# Patient Record
Sex: Male | Born: 1960
Health system: Southern US, Community
[De-identification: ages and names within clinical notes are randomized; demographics above are authoritative.]

## PROBLEM LIST (undated history)

## (undated) DIAGNOSIS — Z9989 Dependence on other enabling machines and devices: Secondary | ICD-10-CM

## (undated) DIAGNOSIS — E785 Hyperlipidemia, unspecified: Secondary | ICD-10-CM

## (undated) DIAGNOSIS — E119 Type 2 diabetes mellitus without complications: Secondary | ICD-10-CM

## (undated) DIAGNOSIS — G4733 Obstructive sleep apnea (adult) (pediatric): Secondary | ICD-10-CM

## (undated) HISTORY — DX: Obstructive sleep apnea (adult) (pediatric): G47.33

## (undated) HISTORY — DX: Hyperlipidemia, unspecified: E78.5

## (undated) HISTORY — DX: Dependence on other enabling machines and devices: Z99.89

## (undated) HISTORY — PX: CATARACT EXTRACTION: SUR2

## (undated) HISTORY — DX: Type 2 diabetes mellitus without complications: E11.9

---

## 1979-08-05 HISTORY — PX: KNEE ARTHROSCOPY: SUR90

## 1997-10-20 ENCOUNTER — Other Ambulatory Visit: Admission: RE | Admit: 1997-10-20 | Discharge: 1997-10-20 | Payer: Self-pay | Admitting: *Deleted

## 2004-10-24 ENCOUNTER — Ambulatory Visit: Payer: Self-pay | Admitting: Family Medicine

## 2005-04-09 ENCOUNTER — Ambulatory Visit: Payer: Self-pay | Admitting: Family Medicine

## 2005-04-14 ENCOUNTER — Ambulatory Visit: Payer: Self-pay | Admitting: Family Medicine

## 2005-06-17 ENCOUNTER — Ambulatory Visit: Payer: Self-pay | Admitting: Family Medicine

## 2005-07-03 ENCOUNTER — Ambulatory Visit: Payer: Self-pay | Admitting: Family Medicine

## 2007-01-28 DIAGNOSIS — E785 Hyperlipidemia, unspecified: Secondary | ICD-10-CM | POA: Insufficient documentation

## 2007-02-04 ENCOUNTER — Ambulatory Visit: Payer: Self-pay | Admitting: Family Medicine

## 2007-02-04 ENCOUNTER — Encounter (INDEPENDENT_AMBULATORY_CARE_PROVIDER_SITE_OTHER): Payer: Self-pay | Admitting: Internal Medicine

## 2007-02-10 ENCOUNTER — Ambulatory Visit: Payer: Self-pay | Admitting: Internal Medicine

## 2007-02-12 LAB — CONVERTED CEMR LAB
AST: 26 units/L (ref 0–37)
Albumin: 4.2 g/dL (ref 3.5–5.2)
Alkaline Phosphatase: 54 units/L (ref 39–117)
BUN: 14 mg/dL (ref 6–23)
Basophils Absolute: 0 10*3/uL (ref 0.0–0.1)
Chloride: 106 meq/L (ref 96–112)
Cholesterol: 246 mg/dL (ref 0–200)
Creatinine, Ser: 1 mg/dL (ref 0.4–1.5)
Direct LDL: 170.8 mg/dL
MCHC: 34.2 g/dL (ref 30.0–36.0)
Monocytes Relative: 7.6 % (ref 3.0–11.0)
Potassium: 4.6 meq/L (ref 3.5–5.1)
RBC: 4.89 M/uL (ref 4.22–5.81)
RDW: 12.4 % (ref 11.5–14.6)
Total Bilirubin: 1 mg/dL (ref 0.3–1.2)
Total CHOL/HDL Ratio: 7.7
Triglycerides: 209 mg/dL (ref 0–149)

## 2007-02-25 ENCOUNTER — Ambulatory Visit: Payer: Self-pay | Admitting: Family Medicine

## 2007-05-27 ENCOUNTER — Encounter (INDEPENDENT_AMBULATORY_CARE_PROVIDER_SITE_OTHER): Payer: Self-pay | Admitting: *Deleted

## 2007-07-30 ENCOUNTER — Ambulatory Visit: Payer: Self-pay | Admitting: Family Medicine

## 2008-05-04 ENCOUNTER — Ambulatory Visit: Payer: Self-pay | Admitting: Family Medicine

## 2010-01-03 ENCOUNTER — Ambulatory Visit: Payer: Self-pay | Admitting: Family Medicine

## 2010-01-03 LAB — CONVERTED CEMR LAB
ALT: 33 units/L (ref 0–53)
AST: 24 units/L (ref 0–37)
Alkaline Phosphatase: 51 units/L (ref 39–117)
BUN: 16 mg/dL (ref 6–23)
Basophils Relative: 0.3 % (ref 0.0–3.0)
Chloride: 104 meq/L (ref 96–112)
Cholesterol: 241 mg/dL — ABNORMAL HIGH (ref 0–200)
Creatinine,U: 333.6 mg/dL
Direct LDL: 160.2 mg/dL
Eosinophils Relative: 2.6 % (ref 0.0–5.0)
HCT: 44.4 % (ref 39.0–52.0)
Hemoglobin: 15.1 g/dL (ref 13.0–17.0)
Lymphocytes Relative: 30 % (ref 12.0–46.0)
Lymphs Abs: 2.1 10*3/uL (ref 0.7–4.0)
Microalb Creat Ratio: 0.4 mg/g (ref 0.0–30.0)
Microalb, Ur: 1.3 mg/dL (ref 0.0–1.9)
Monocytes Relative: 7.6 % (ref 3.0–12.0)
Neutro Abs: 4.1 10*3/uL (ref 1.4–7.7)
Potassium: 4.7 meq/L (ref 3.5–5.1)
RBC: 4.85 M/uL (ref 4.22–5.81)
RDW: 13.5 % (ref 11.5–14.6)
Sodium: 142 meq/L (ref 135–145)
Total Bilirubin: 0.6 mg/dL (ref 0.3–1.2)
Total CHOL/HDL Ratio: 6
WBC: 6.9 10*3/uL (ref 4.5–10.5)

## 2010-01-08 ENCOUNTER — Encounter (INDEPENDENT_AMBULATORY_CARE_PROVIDER_SITE_OTHER): Payer: Self-pay | Admitting: *Deleted

## 2010-01-08 ENCOUNTER — Ambulatory Visit: Payer: Self-pay | Admitting: Family Medicine

## 2010-03-01 ENCOUNTER — Encounter (INDEPENDENT_AMBULATORY_CARE_PROVIDER_SITE_OTHER): Payer: Self-pay | Admitting: *Deleted

## 2010-03-04 ENCOUNTER — Ambulatory Visit: Payer: Self-pay | Admitting: Gastroenterology

## 2010-03-18 ENCOUNTER — Ambulatory Visit: Payer: Self-pay | Admitting: Gastroenterology

## 2010-03-18 LAB — HM COLONOSCOPY

## 2010-09-03 NOTE — Miscellaneous (Signed)
Summary: previsit/rm  Clinical Lists Changes  Medications: Added new medication of MOVIPREP 100 GM  SOLR (PEG-KCL-NACL-NASULF-NA ASC-C) As per prep instructions. - Signed Rx of MOVIPREP 100 GM  SOLR (PEG-KCL-NACL-NASULF-NA ASC-C) As per prep instructions.;  #1 x 0;  Signed;  Entered by: Sherren Kerns RN;  Authorized by: Mardella Layman MD [facility];  Method used: [facility]  [address], [place], [place]  [address], Ph: [phone] or [phone], Fax: [phone] Observations: Added new observation of ALLERGY REV: Done (03/04/2010 8:28)    Prescriptions: MOVIPREP 100 GM  SOLR (PEG-KCL-NACL-NASULF-NA ASC-C) As per prep instructions.  #1 x 0   Entered by:   Sherren Kerns RN   Authorized by:   Mardella Layman MD [facility]   Signed by:   Sherren Kerns RN on 03/04/2010   Method used:   [facility]ronically [facility]        [facility]  [address] (retail)       [address]       [place], [place]  [address]       Ph: [phone] or [phone]       Fax: [phone]   RxID:   [id number]

## 2010-09-03 NOTE — Letter (Signed)
Summary: Previsit letter  [facility]  [address] [place], [place] [address]   Phone: [phone]  Fax: [phone]       01/08/2010 MRN: [id number]  Eric Fitzgerald [facility] [address] [place], [place]  [address]  Dear Mr. CHUONG,  Welcome to the Gastroenterology Division at [facility].    You are scheduled to see a nurse for your pre-procedure visit on 03-04-10 at 8:30am on the 3rd floor at [facility], [address]  We ask that you try to arrive at our office 15 minutes prior to your appointment time to allow for check-in.  Your nurse visit will consist of discussing your medical and surgical history, your immediate family medical history, and your medications.    Please bring a complete list of all your medications or, if you prefer, bring the medication bottles and we will list them.  We will need to be aware of both prescribed and over the counter drugs.  We will need to know exact dosage information as well.  If you are on blood thinners (Coumadin, Plavix, Aggrenox, Ticlid, etc.) please call our office today/prior to your appointment, as we need to consult with your physician about holding your medication.  Your COLONOSCOPY   is scheduled for 03-18-10 9:30am but check in is at 8:30am.  Please be prepared to read and sign documents such as consent forms, a financial agreement, and acknowledgement forms.  If necessary, and with your consent, a friend or relative is welcome to sit-in on the nurse visit with you.  Please bring your insurance card so that we may make a copy of it.  If your insurance requires a referral to see a specialist, please bring your referral form from your primary care physician.  No co-pay is required for this nurse visit.     If you cannot keep your appointment, please call [phone] to cancel or reschedule prior to your appointment date.  This allows [place] the opportunity to schedule an appointment for another patient in need of care.     Thank you for choosing [facility] for your medical needs.  We appreciate the opportunity to care for you.  Please visit [place] at our website  to learn more about our practice.                     Sincerely.                                                                                                                   The Gastroenterology Division

## 2010-09-03 NOTE — Procedures (Signed)
Summary: Colonoscopy  Patient: Eric Fitzgerald Note: All result statuses are Final unless otherwise noted.  Tests: (1) Colonoscopy (COL)   COL Colonoscopy           DONE     [facility]     [address]     [place], [place]  [address]           COLONOSCOPY PROCEDURE REPORT           PATIENT:  Eric Fitzgerald, Eric Fitzgerald  MR#:  [id number]     BIRTHDATE:  14-Nov-1960, 49 yrs. old  GENDER:  male     ENDOSCOPIST:  Vania Rea. Jarold Motto, MD, [facility]     REF. BY:  Laurita Quint, M.D.     PROCEDURE DATE:  03/18/2010     PROCEDURE:  Average-risk screening colonoscopy     G0121     ASA CLASS:  Class I     INDICATIONS:  Routine Risk Screening     MEDICATIONS:   Fentanyl 50 mcg IV, Versed 5 mg IV           DESCRIPTION OF PROCEDURE:   After the risks benefits and     alternatives of the procedure were thoroughly explained, informed     consent was obtained.  Digital rectal exam was performed and     revealed no abnormalities.   The LB CF-[id number] endoscope     was introduced through the anus and advanced to the cecum, which     was identified by both the appendix and ileocecal valve, without     limitations.  The quality of the prep was excellent, using     MoviPrep.  The instrument was then slowly withdrawn as the colon     was fully examined.     <<PROCEDUREIMAGES>>           FINDINGS:  No polyps or cancers were seen.  This was otherwise a     normal examination of the colon.   Retroflexed views in the rectum     revealed no abnormalities.    The scope was then withdrawn from     the patient and the procedure completed.           COMPLICATIONS:  None     ENDOSCOPIC IMPRESSION:     1) No polyps or cancers     2) Otherwise normal examination     RECOMMENDATIONS:     1) Continue current colorectal screening recommendations for     "routine risk" patients with a repeat colonoscopy in 10 years.     REPEAT EXAM:  No           ______________________________     Vania Rea. Jarold Motto, MD,  Clementeen Graham           CC:           n.     eSIGNED:   Vania Rea. Patterson at 03/18/2010 10:10 AM           Harriett Sine, [phone]  Note: An exclamation mark (!) indicates a result that was not dispersed into the flowsheet. Document Creation Date: 03/18/2010 10:11 AM _______________________________________________________________________  (1) Order result status: Final Collection or observation date-time: 03/18/2010 10:04 Requested date-time:  Receipt date-time:  Reported date-time:  Referring Physician:   Ordering Physician: Sheryn Bison [phone]) Specimen Source:  Source: Launa Grill Order Number: [id number] Lab site:   Appended Document: Colonoscopy    Clinical Lists Changes  Observations: Added new observation of COLONNXTDUE:  03/2020 (03/18/2010 11:04)     

## 2010-09-03 NOTE — Assessment & Plan Note (Signed)
Summary: CPX / LFW   Vital Signs:  Patient profile:   50 year old male Height:      70 inches Weight:      243 pounds BMI:     34.99 Temp:     99.0 degrees F oral Pulse rate:   68 / minute Pulse rhythm:   regular BP sitting:   110 / 80  (left arm) Cuff size:   large  Vitals Entered By: Linde Gillis CMA Duncan Dull) (January 08, 2010 2:50 PM) CC: complete physical, patient would prefer colonoscopy over hemmocult cards   History of Present Illness: Pt here with his wife for Comp Exam. He has no complaints. He feels fine.   Preventive Screening-Counseling & Management  Alcohol-Tobacco     Alcohol drinks/day: 0     Smoking Status: never  Caffeine-Diet-Exercise     Caffeine use/day: 4     Does Patient Exercise: no  Problems Prior to Update: 1)  Uri  (ICD-465.9) 2)  Dermatophytosis of Groin and Perianal Area  (ICD-110.3) 3)  Syndrome X  (ICD-277.7) 4)  Examination, Routine Medical  (ICD-V70.0) 5)  Screening For Malignant Neoplasm, Prostate  (ICD-V76.44) 6)  Snoring  (ICD-786.09) 7)  Chest Pain  (ICD-786.50) 8)  Well Adult Exam  (ICD-V70.0) 9)  Hyperlipidemia  (ICD-272.4)  Medications Prior to Update: 1)  Naftin 1 %  Crea (Naftifine Hcl) .... Apply To Affected Area Once Daily As Directed 2)  Tussionex Pennkinetic Er 8-10 Mg/9ml Lqcr (Chlorpheniramine-Hydrocodone) .... One Tsp By Mouth At Night As Needed Cough  Allergies: 1)  ! Tricor (Fenofibrate) 2)  ! Niacin  Past History:  Past Medical History: Last updated: 01/28/2007 Hyperlipidemia  Family History: Last updated: 01/08/2010 Father: Alive 15  CABG, HTN   no further heart problems Mother: dec. 57, brain tumor Sister A 67  Bipolar Sister A 47 Depression CV:  (+) CAD, MGF died of MI, M.Uncle died MI BP:  (+) father, CABG father CA:  (+) brain tumor, mother Depression:  (+) throughout Stroke:  (-)  Social History: Last updated: 01/28/2007 Never Smoked Alcohol use-no Drug use-no Marital Status: Married lives  with wife Children: 2 at home Occupation: Emergency planning/management officer  Risk Factors: Alcohol Use: 0 (01/08/2010) Caffeine Use: 4 (01/08/2010) Exercise: no (01/08/2010)  Risk Factors: Smoking Status: never (01/08/2010)  Past Surgical History: R Arthroscopy 1981  Family History: Father: Alive 19  CABG, HTN   no further heart problems Mother: dec. 57, brain tumor Sister A 38  Bipolar Sister A 47 Depression CV:  (+) CAD, MGF died of MI, M.Uncle died MI BP:  (+) father, CABG father CA:  (+) brain tumor, mother Depression:  (+) throughout Stroke:  (-)  Social History: Caffeine use/day:  4  Review of Systems General:  Denies chills, fatigue, fever, sweats, weakness, and weight loss. Eyes:  Denies blurring, discharge, and eye pain. ENT:  Denies decreased hearing, ear discharge, and earache. CV:  Denies chest pain or discomfort, fainting, fatigue, palpitations, shortness of breath with exertion, swelling of feet, and swelling of hands. Resp:  Denies cough, shortness of breath, and wheezing. GI:  Complains of indigestion; denies abdominal pain, bloody stools, change in bowel habits, constipation, dark tarry stools, diarrhea, loss of appetite, nausea, vomiting, vomiting blood, and yellowish skin color. GU:  Complains of urinary frequency; denies discharge, dysuria, and nocturia. MS:  Denies joint pain, low back pain, muscle aches, cramps, and stiffness. Derm:  Denies dryness, itching, and rash. Neuro:  Denies poor balance,  tingling, and tremors.  Physical Exam  General:  Well-developed,well-nourished,in no acute distress; alert,appropriate and cooperative throughout examination, overweight Head:  Normocephalic and atraumatic without obvious abnormalities. No apparent alopecia or balding. Sinuses nontender. Eyes:  Conjunctiva clear bilaterally.  Ears:  External ear exam shows no significant lesions or deformities.  Otoscopic examination reveals clear canals, tympanic membranes are  intact bilaterally without bulging, retraction, inflammation or discharge. Hearing is grossly normal bilaterally. L TM dull to LR. Nose:  External nasal examination shows no deformity or inflammation. Nasal mucosa are pink and moist without lesions or exudates. Mouth:  Oral mucosa and oropharynx without lesions or exudates.  Teeth in good repair. Neck:  No deformities, masses, or tenderness noted. Chest Wall:  No deformities, masses, tenderness or gynecomastia noted. Breasts:  No masses or gynecomastia noted Lungs:  Normal respiratory effort, chest expands symmetrically. Lungs are clear to auscultation, no crackles or wheezes. Heart:  Normal rate and regular rhythm. S1 and S2 normal without gallop, murmur, click, rub or other extra sounds. Abdomen:  soft, non-tender, no masses, no abdominal hernia, no inguinal hernia, no hepatomegaly, and no splenomegaly.   Rectal:  No external abnormalities noted. Normal sphincter tone. No rectal masses or tenderness. G neg. Genitalia:  Testes bilaterally descended without nodularity, tenderness or masses. No scrotal masses or lesions. No penis lesions or urethral discharge. Prostate:  Prostate gland firm and smooth, no enlargement, nodularity, tenderness, mass, asymmetry or induration. Msk:  No deformity or scoliosis noted of thoracic or lumbar spine.   Pulses:  R and L carotid,radial,femoral,dorsalis pedis and posterior tibial pulses are full and equal bilaterally Extremities:  No clubbing, cyanosis, edema, or deformity noted with normal full range of motion of all joints.   Neurologic:  No cranial nerve deficits noted. Station and gait are normal. Sensory, motor and coordinative functions appear intact. Skin:  Intact without suspicious lesions or rashes, a few scattered benign moles. Cervical Nodes:  No lymphadenopathy noted Inguinal Nodes:  No significant adenopathy Psych:  Cognition and judgment appear intact. Alert and cooperative with normal attention span  and concentration. No apparent delusions, illusions, hallucinations   Impression & Recommendations:  Problem # 1:  WELL ADULT EXAM (ICD-V70.0) Assessment Comment Only  Reviewed preventive care protocols, scheduled due services, and updated immunizations.  Problem # 2:  SYNDROME X (ICD-277.7) Assessment: Unchanged Discussed.Needs diet and exercise.  Problem # 3:  SNORING (ICD-786.09) Assessment: Unchanged Discussed losing weight before sleep study ordered.  Problem # 4:  HYPERLIPIDEMIA (ICD-272.4) Assessment: Unchanged Discussed diet and exercise. Recheck in future and treat if needed. Labs Reviewed: SGOT: 24 (01/03/2010)   SGPT: 33 (01/03/2010)   HDL:37.30 (01/03/2010), 31.8 (02/10/2007)  LDL:DEL (02/10/2007)  Chol:241 (01/03/2010), 246 (02/10/2007)  Trig:247.0 (01/03/2010), 209 (02/10/2007)  Other Orders: Gastroenterology Referral (GI)  Patient Instructions: 1)  Refer for Colonoscopy.  Prior Medications (reviewed today): None Current Allergies (reviewed today): ! TRICOR (FENOFIBRATE) ! NIACIN

## 2010-09-03 NOTE — Letter (Signed)
Summary: Eric Fitzgerald Instructions  Eric Fitzgerald  [address] [place], [place] [address]   Phone: [phone]  Fax: [phone]       Eric Fitzgerald    May 29, 1961    MRN: [id number]        Procedure Day Dorna Bloom:  Eric Fitzgerald  03/18/10     Arrival Time:  8:30AM     Procedure Time:  9:30AM     Location of Procedure:                    _ X_  [facility] (4th Floor)                        PREPARATION FOR COLONOSCOPY WITH MOVIPREP   Starting 5 days prior to your procedure 03/13/10 do not eat nuts, seeds, popcorn, corn, beans, peas,  salads, or any raw vegetables.  Do not take any fiber supplements (e.g. Metamucil, Citrucel, and Benefiber).  THE DAY BEFORE YOUR PROCEDURE         DATE: 03/17/10  DAY: SUNDAY  1.  Drink clear liquids the entire day-NO SOLID FOOD  2.  Do not drink anything colored red or purple.  Avoid juices with pulp.  No orange juice.  3.  Drink at least 64 oz. (8 glasses) of fluid/clear liquids during the day to prevent dehydration and help the prep work efficiently.  CLEAR LIQUIDS INCLUDE: Water Jello Ice Popsicles Tea (sugar ok, no milk/cream) Powdered fruit flavored drinks Coffee (sugar ok, no milk/cream) Gatorade Juice: apple, white grape, white cranberry  Lemonade Clear bullion, consomm, broth Carbonated beverages (any kind) Strained chicken noodle soup Hard Candy                             4.  In the morning, mix first dose of MoviPrep solution:    Empty 1 Pouch A and 1 Pouch B into the disposable container    Add lukewarm drinking water to the top line of the container. Mix to dissolve    Refrigerate (mixed solution should be used within 24 hrs)  5.  Begin drinking the prep at 5:00 p.m. The MoviPrep container is divided by 4 marks.   Every 15 minutes drink the solution down to the next mark (approximately 8 oz) until the full liter is complete.   6.  Follow completed prep with 16 oz of clear liquid of your choice (Nothing  red or purple).  Continue to drink clear liquids until bedtime.  7.  Before going to bed, mix second dose of MoviPrep solution:    Empty 1 Pouch A and 1 Pouch B into the disposable container    Add lukewarm drinking water to the top line of the container. Mix to dissolve    Refrigerate  THE DAY OF YOUR PROCEDURE      DATE: 03/18/10  DAY: MONDAY  Beginning at 4:30AM (5 hours before procedure):         1. Every 15 minutes, drink the solution down to the next mark (approx 8 oz) until the full liter is complete.  2. Follow completed prep with 16 oz. of clear liquid of your choice.    3. You may drink clear liquids until 7:30AM (2 HOURS BEFORE PROCEDURE).   MEDICATION INSTRUCTIONS  Unless otherwise instructed, you should take regular prescription medications with a small sip of water   as early as possible the morning  of your procedure.   Additional medication instructions: n/a         OTHER INSTRUCTIONS  You will need a responsible adult at least 51 years of age to accompany you and drive you home.   This person must remain in the waiting room during your procedure.  Wear loose fitting clothing that is easily removed.  Leave jewelry and other valuables at home.  However, you may wish to bring a book to read or  an iPod/MP3 player to listen to music as you wait for your procedure to start.  Remove all body piercing jewelry and leave at home.  Total time from sign-in until discharge is approximately 2-3 hours.  You should go home directly after your procedure and rest.  You can resume normal activities the  day after your procedure.  The day of your procedure you should not:   Drive   Make legal decisions   Operate machinery   Drink alcohol   Return to work  You will receive specific instructions about eating, activities and medications before you leave.    The above instructions have been reviewed and explained to me by  Eric Kerns RN  March 04, 2010  8:42 AM    I fully understand and can verbalize these instructions _____________________________ Date _________

## 2011-03-17 ENCOUNTER — Ambulatory Visit (INDEPENDENT_AMBULATORY_CARE_PROVIDER_SITE_OTHER)
Admission: RE | Admit: 2011-03-17 | Discharge: 2011-03-17 | Disposition: A | Discharge status: Home / Self Care | Payer: Self-pay | Source: Ambulatory Visit | Attending: Family Medicine | Admitting: Family Medicine

## 2011-03-17 ENCOUNTER — Encounter: Payer: Self-pay | Admitting: Family Medicine

## 2011-03-17 ENCOUNTER — Ambulatory Visit (INDEPENDENT_AMBULATORY_CARE_PROVIDER_SITE_OTHER): Payer: Self-pay | Admitting: Family Medicine

## 2011-03-17 ENCOUNTER — Encounter: Payer: Self-pay | Admitting: Internal Medicine

## 2011-03-17 VITALS — BP 124/80 | HR 100 | Temp 100.7°F | Ht 70.5 in | Wt 240.8 lb

## 2011-03-17 DIAGNOSIS — R05 Cough: Secondary | ICD-10-CM

## 2011-03-17 DIAGNOSIS — R059 Cough, unspecified: Secondary | ICD-10-CM

## 2011-03-17 DIAGNOSIS — J189 Pneumonia, unspecified organism: Secondary | ICD-10-CM | POA: Insufficient documentation

## 2011-03-17 MED ORDER — AZITHROMYCIN 250 MG PO TABS: ORAL_TABLET | ORAL | Status: AC

## 2011-03-17 MED ORDER — GUAIFENESIN-CODEINE 100-10 MG/5ML PO SYRP: 5.0000 mL | ORAL_SOLUTION | Freq: Three times a day (TID) | ORAL | Status: AC | PRN

## 2011-03-17 NOTE — Assessment & Plan Note (Addendum)
Concern for PNA - CXR checked, LLL PNA on my read. Treat with zpack, codeine cough syrup for night time. rec continue mucinex and advil. Push fluids. Ok for [employer]. Update if red flags or not improving as expected.

## 2011-03-17 NOTE — Progress Notes (Signed)
  Subjective:    Patient ID: Eric Fitzgerald, male    DOB: 04-16-1961, 50 y.o.   MRN: [id number]  HPI CC: cough  5d h/o feeling achey, with cough.  Started with temperature and cough.  Tried taking advil cold/sinus, mucinex.  Coughing slightly productive.  Feeling feverish.    Mild nausea, abd pain, emesis x1.  Decreased appetite.  + rash left chest below nipple, pruritic.  Denies ST, congestion, RN.  No diarrhea, constipation.  No HA, RN.  + parents-in-law sick at home with PNA, no children sick.  Father in law smokes inside.  No h/o asthma, COPD.  Thinks may have small amt asthma.  Review of Systems Per HPI    Objective:   Physical Exam  Nursing note and vitals reviewed. Constitutional: He appears well-developed and well-nourished. No distress.  HENT:  Head: Normocephalic and atraumatic.  Right Ear: Hearing, tympanic membrane, external ear and ear canal normal.  Left Ear: Hearing, tympanic membrane, external ear and ear canal normal.  Nose: No mucosal edema or rhinorrhea. Right sinus exhibits no maxillary sinus tenderness and no frontal sinus tenderness. Left sinus exhibits no maxillary sinus tenderness and no frontal sinus tenderness.  Mouth/Throat: Uvula is midline, oropharynx is clear and moist and mucous membranes are normal. No oropharyngeal exudate or posterior oropharyngeal edema.  Eyes: Conjunctivae and EOM are normal. Pupils are equal, round, and reactive to light. No scleral icterus.  Neck: Normal range of motion. Neck supple.  Cardiovascular: Normal rate, regular rhythm, normal heart sounds and intact distal pulses.   No murmur heard. Pulmonary/Chest: Effort normal. No respiratory distress. He has no decreased breath sounds. He has no wheezes. He has no rhonchi. He has rales (and crackles) in the right lower field and the left lower field.  Lymphadenopathy:    He has no cervical adenopathy.  Skin: Skin is warm and dry. No rash noted.  Psychiatric: He has a normal mood and  affect.          Assessment & Plan:

## 2011-03-17 NOTE — Patient Instructions (Signed)
Take zpack as well as cough syrup for night time use (may make you sleepy). Continue mucinex with plenty of fluid and advil for symptomatic relief. Push fluids and plenty of rest over next few days. Update [place] if fever >101.5, worsening cough or shortness of breath, or not improving as expected.

## 2011-04-16 ENCOUNTER — Other Ambulatory Visit: Payer: Self-pay | Admitting: Family Medicine

## 2011-04-16 DIAGNOSIS — Z125 Encounter for screening for malignant neoplasm of prostate: Secondary | ICD-10-CM

## 2011-04-16 DIAGNOSIS — Z0000 Encounter for general adult medical examination without abnormal findings: Secondary | ICD-10-CM

## 2011-04-17 ENCOUNTER — Other Ambulatory Visit (INDEPENDENT_AMBULATORY_CARE_PROVIDER_SITE_OTHER): Payer: BC Managed Care – PPO

## 2011-04-17 DIAGNOSIS — Z0000 Encounter for general adult medical examination without abnormal findings: Secondary | ICD-10-CM

## 2011-04-17 DIAGNOSIS — Z125 Encounter for screening for malignant neoplasm of prostate: Secondary | ICD-10-CM

## 2011-04-17 LAB — LDL CHOLESTEROL, DIRECT: Direct LDL: 160.8 mg/dL

## 2011-04-17 LAB — COMPREHENSIVE METABOLIC PANEL
Albumin: 4.2 g/dL (ref 3.5–5.2)
BUN: 16 mg/dL (ref 6–23)
CO2: 29 mEq/L (ref 19–32)
Calcium: 9.2 mg/dL (ref 8.4–10.5)
Chloride: 103 mEq/L (ref 96–112)
Creatinine, Ser: 1 mg/dL (ref 0.4–1.5)
GFR: 84.34 mL/min (ref >60.00)
Glucose, Bld: 102 mg/dL — ABNORMAL HIGH (ref 70–99)
Potassium: 5 mEq/L (ref 3.5–5.1)

## 2011-04-17 LAB — LIPID PANEL: Cholesterol: 226 mg/dL — ABNORMAL HIGH (ref 0–200)

## 2011-04-22 ENCOUNTER — Encounter: Payer: Self-pay | Admitting: Family Medicine

## 2011-04-22 ENCOUNTER — Ambulatory Visit (INDEPENDENT_AMBULATORY_CARE_PROVIDER_SITE_OTHER): Payer: BC Managed Care – PPO | Admitting: Family Medicine

## 2011-04-22 VITALS — BP 128/80 | HR 76 | Temp 97.3°F | Ht 71.0 in | Wt 243.0 lb

## 2011-04-22 DIAGNOSIS — Z23 Encounter for immunization: Secondary | ICD-10-CM

## 2011-04-22 DIAGNOSIS — R7309 Other abnormal glucose: Secondary | ICD-10-CM

## 2011-04-22 DIAGNOSIS — E119 Type 2 diabetes mellitus without complications: Secondary | ICD-10-CM | POA: Insufficient documentation

## 2011-04-22 DIAGNOSIS — Z0000 Encounter for general adult medical examination without abnormal findings: Secondary | ICD-10-CM

## 2011-04-22 DIAGNOSIS — N400 Enlarged prostate without lower urinary tract symptoms: Secondary | ICD-10-CM

## 2011-04-22 DIAGNOSIS — E785 Hyperlipidemia, unspecified: Secondary | ICD-10-CM

## 2011-04-22 NOTE — Assessment & Plan Note (Signed)
DRE with slightly enlarged prostate, however given asxs declines treatment for now.  Continue to monitor.

## 2011-04-22 NOTE — Assessment & Plan Note (Signed)
Last fasting sugar 102, return in 6 mo for recheck fasting.

## 2011-04-22 NOTE — Progress Notes (Signed)
  Subjective:    Patient ID: Eric Fitzgerald, male    DOB: 09/08/1960, 50 y.o.   MRN: [id number]  HPI CC: CPE  No concerns today.  Reviewed labwork. Preventative: Colonoscopy - 03/2010 normal, rpt 10 years.  No family hx colon cancer Prostate - nocturia x2-3at night.  Strong stream.  No fmhx prostate cancer, no h/o BPH.  Discussed prostate screening. Desires yearly prostate screening. Flu shot today.  UTD tetanus.  Review of Systems  Constitutional: Negative for fever, chills, activity change, appetite change, fatigue and unexpected weight change.  HENT: Negative for hearing loss and neck pain.   Eyes: Negative for visual disturbance.  Respiratory: Negative for cough, chest tightness, shortness of breath and wheezing.   Cardiovascular: Negative for chest pain, palpitations and leg swelling.  Gastrointestinal: Negative for nausea, vomiting, abdominal pain, diarrhea, constipation, blood in stool and abdominal distention.  Genitourinary: Negative for hematuria and difficulty urinating.  Musculoskeletal: Negative for myalgias and arthralgias.  Skin: Negative for rash.  Neurological: Negative for dizziness, seizures, syncope and headaches.  Hematological: Does not bruise/bleed easily.  Psychiatric/Behavioral: Negative for dysphoric mood. The patient is not nervous/anxious.        Objective:   Physical Exam  Nursing note and vitals reviewed. Constitutional: He is oriented to person, place, and time. He appears well-developed and well-nourished. No distress.  HENT:  Head: Normocephalic and atraumatic.  Right Ear: External ear normal.  Left Ear: External ear normal.  Nose: Nose normal.  Mouth/Throat: Oropharynx is clear and moist. No oropharyngeal exudate.  Eyes: Conjunctivae and EOM are normal. Pupils are equal, round, and reactive to light.  Neck: Normal range of motion. Neck supple. No thyromegaly present.  Cardiovascular: Normal rate, regular rhythm, normal heart sounds and intact  distal pulses.   No murmur heard. Pulses:      Radial pulses are 2+ on the right side, and 2+ on the left side.  Pulmonary/Chest: Effort normal and breath sounds normal. No respiratory distress. He has no wheezes. He has no rales.  Abdominal: Soft. Bowel sounds are normal. He exhibits no distension and no mass. There is no tenderness. There is no rebound and no guarding.  Genitourinary: Rectum normal. Rectal exam shows no external hemorrhoid, no internal hemorrhoid, no fissure, no mass and anal tone normal. Prostate is enlarged (mild). Prostate is not tender.  Musculoskeletal: Normal range of motion.  Lymphadenopathy:    He has no cervical adenopathy.  Neurological: He is alert and oriented to person, place, and time.       CN grossly intact, station and gait intact  Skin: Skin is warm and dry. No rash noted.  Psychiatric: He has a normal mood and affect. His behavior is normal. Judgment and thought content normal.          Assessment & Plan:

## 2011-04-22 NOTE — Assessment & Plan Note (Signed)
Discussed healthy lifestyle and diet. Pt will work on increasing activity, has membership at gym, will start using. Flu shot today. UTD colon screening.

## 2011-04-22 NOTE — Patient Instructions (Signed)
See instructions handed to patient.

## 2011-04-22 NOTE — Assessment & Plan Note (Signed)
Somewhat elevated cholesterols. Discussed heatlhy eating to lower LDL.

## 2011-04-23 NOTE — Progress Notes (Signed)
See other note

## 2011-09-10 IMAGING — CR DG CHEST 2V
2 series · 2 of 2 positions shown · non-contrast
Comparison: None

CLINICAL DATA: Cough.  Fever.  Abnormal lung exam.

CHEST - 2 VIEW

[view not recorded (1 of 2)]
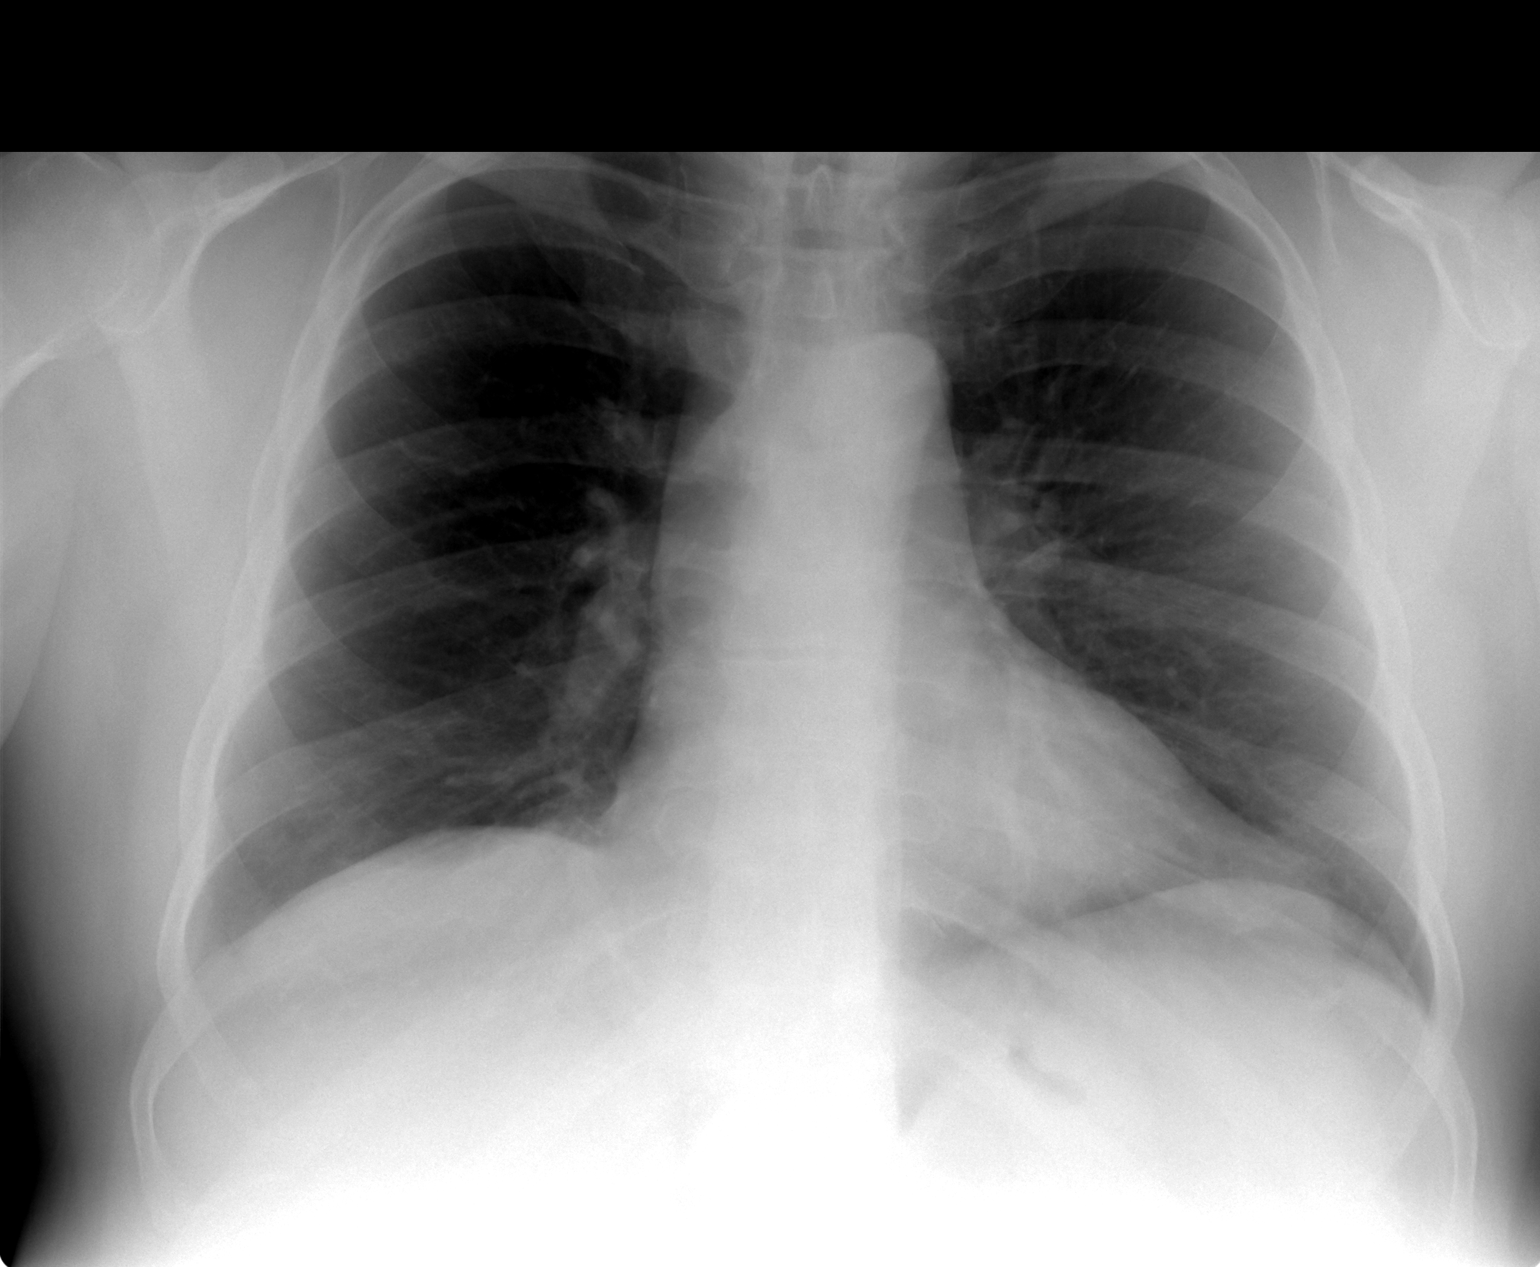

[view not recorded (2 of 2)]
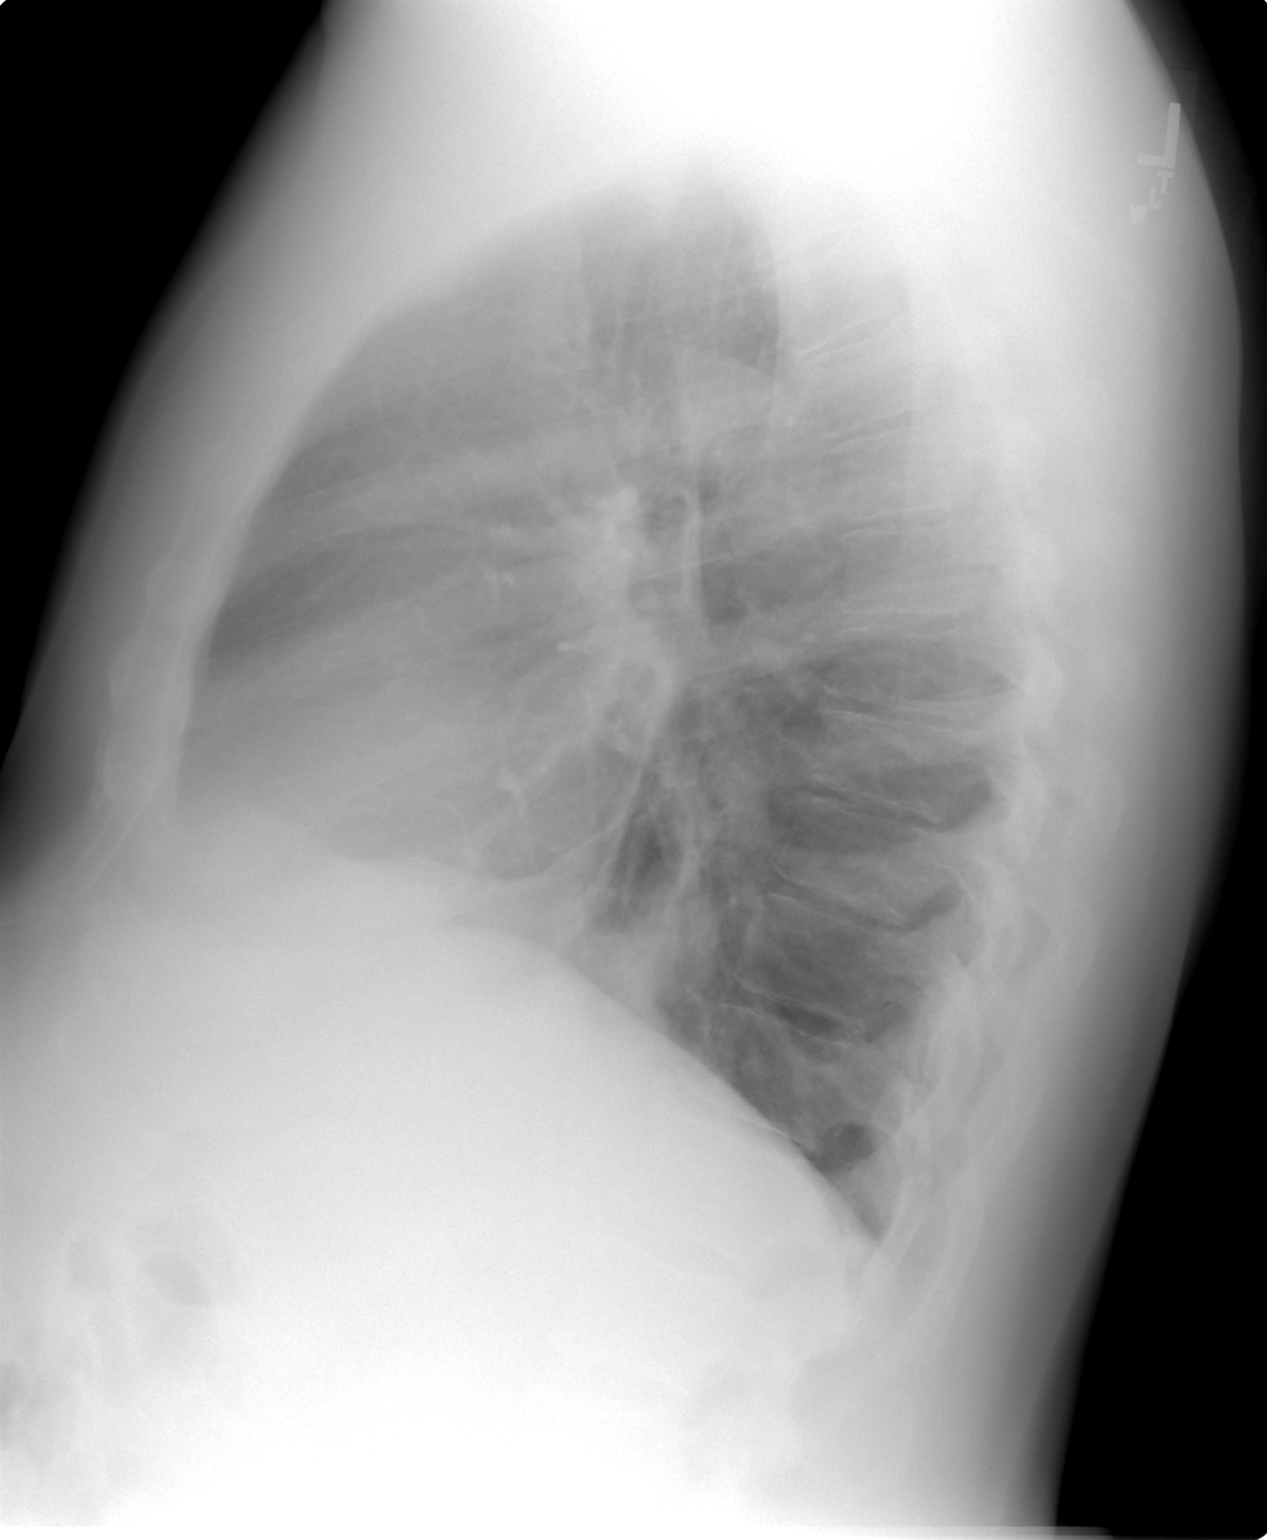

[2 of 2 positions shown; findings below may reference images not displayed]

FINDINGS: Heart size is normal.  Mediastinal shadows are normal.
The right lung is clear.  There is patchy infiltrate in the left
lower lobe.  No effusion.  No significant bony finding.
IMPRESSION: Patchy infiltrate at the left base consistent with left lower lobe
pneumonia.

## 2012-12-10 ENCOUNTER — Ambulatory Visit (INDEPENDENT_AMBULATORY_CARE_PROVIDER_SITE_OTHER): Payer: BC Managed Care – PPO | Admitting: Family Medicine

## 2012-12-10 ENCOUNTER — Encounter: Payer: Self-pay | Admitting: Family Medicine

## 2012-12-10 VITALS — BP 128/90 | HR 84 | Temp 98.4°F | Ht 71.0 in | Wt 247.2 lb

## 2012-12-10 DIAGNOSIS — Z23 Encounter for immunization: Secondary | ICD-10-CM

## 2012-12-10 DIAGNOSIS — E785 Hyperlipidemia, unspecified: Secondary | ICD-10-CM

## 2012-12-10 DIAGNOSIS — N400 Enlarged prostate without lower urinary tract symptoms: Secondary | ICD-10-CM

## 2012-12-10 DIAGNOSIS — Z125 Encounter for screening for malignant neoplasm of prostate: Secondary | ICD-10-CM

## 2012-12-10 DIAGNOSIS — Z0000 Encounter for general adult medical examination without abnormal findings: Secondary | ICD-10-CM

## 2012-12-10 LAB — BASIC METABOLIC PANEL
CO2: 26 mEq/L (ref 19–32)
Calcium: 9.5 mg/dL (ref 8.4–10.5)
Creatinine, Ser: 1.2 mg/dL (ref 0.4–1.5)
GFR: 69.11 mL/min (ref >60.00)
Glucose, Bld: 95 mg/dL (ref 70–99)

## 2012-12-10 LAB — LIPID PANEL: HDL: 36.4 mg/dL — ABNORMAL LOW (ref >39.00)

## 2012-12-10 LAB — PSA: PSA: 0.66 ng/mL (ref 0.10–4.00)

## 2012-12-10 NOTE — Assessment & Plan Note (Addendum)
Preventative protocols reviewed and updated unless pt declined. Discussed healthy diet and lifestyle. Tdap today.  Discussed screening abd [place] - and young age, and nonsmoker status. Looked at different guidelines: USPSTF - start at 54yo in smokers AHA - start at 54yo in fmhx Vascular societies - start at 55yo in fmhx I've asked him to check with insurance about coverage, consider at age 81yo

## 2012-12-10 NOTE — Patient Instructions (Addendum)
Let's ether start fish oil supplement daily or increase fish in diet - this will help lower triglycerides. Blood work today. Tetanus and pertussis today. Check with insurance if they will cover screening ultrasound for abdominal aneurysm at age 52 given family history

## 2012-12-10 NOTE — Assessment & Plan Note (Signed)
Minimal - continue to monitor. Recommended back off drinking fluids at bedtime.

## 2012-12-10 NOTE — Assessment & Plan Note (Signed)
Persistently elevated in past - recheck today. Discussed increased fish in diet and consider fish oil supplementation.

## 2012-12-10 NOTE — Progress Notes (Signed)
Subjective:    Patient ID: Eric Fitzgerald, male    DOB: 26-Aug-1960, 52 y.o.   MRN: [id number]  HPI CC: CPE   Father with h/o AAA - found at age 77, treated with stent.  Asks about screening aneurysm.  Pt has never smoked. BP Readings from Last 3 Encounters:  12/10/12 128/90  04/22/11 128/80  04/22/11 128/80   Dyslipidemia - last check was >1 yr ago.  Eats fish seldom.  Preventative:  Colonoscopy - 03/2010 normal, rpt 10 years. No family hx colon cancer  Prostate - nocturia x2-3at night. Strong stream. No fmhx prostate cancer, no h/o BPH. Discussed prostate screening. Desires yearly prostate screening.  Flu shot occasionally Tetanus - unsure.  Tdap today.  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  Past histories reviewed and updated if relevant as below. Patient Active Problem List   Diagnosis Date Noted  . Prediabetes 04/22/2011  . BPH (benign prostatic hypertrophy) 04/22/2011  . Healthcare maintenance 04/16/2011  . SYNDROME X 02/25/2007  . SNORING 02/04/2007  . HYPERLIPIDEMIA 01/28/2007   Past Medical History  Diagnosis Date  . HLD (hyperlipidemia)    Past Surgical History  Procedure Laterality Date  . Knee arthroscopy  1981    Right   History  Substance Use Topics  . Smoking status: Never Smoker   . Smokeless tobacco: Never Used  . Alcohol Use: Yes     Comment: Rare   Family History  Problem Relation Age of Onset  . CAD Father 65    5v CABG  . Other Mother 47    Brain tumor  . Bipolar disorder Sister   . Depression Sister   . CAD Maternal Grandfather     MI  . CAD Maternal Uncle     MI  . AAA (abdominal aortic aneurysm) Father     smoker  . Stroke Neg Hx   . Diabetes Neg Hx    Allergies  Allergen Reactions  . Fenofibrate     REACTION: joint pain, aches  . Niacin     REACTION:  hives, skin burning   No current outpatient prescriptions on file prior to visit.   No current facility-administered medications on file prior to visit.     Review of Systems  Constitutional: Negative for fever, chills, activity change, appetite change, fatigue and unexpected weight change.  HENT: Negative for hearing loss and neck pain.   Eyes: Negative for visual disturbance.  Respiratory: Negative for cough, chest tightness, shortness of breath and wheezing.   Cardiovascular: Negative for chest pain, palpitations and leg swelling.  Gastrointestinal: Negative for nausea, vomiting, abdominal pain, diarrhea, constipation, blood in stool and abdominal distention.  Genitourinary: Negative for hematuria and difficulty urinating.  Musculoskeletal: Negative for myalgias and arthralgias.  Skin: Negative for rash.  Neurological: Negative for dizziness, seizures, syncope and headaches.  Hematological: Negative for adenopathy. Does not bruise/bleed easily.  Psychiatric/Behavioral: Negative for dysphoric mood. The patient is not nervous/anxious.        Objective:   Physical Exam  Nursing note and vitals reviewed. Constitutional: He is oriented to person, place, and time. He appears well-developed and well-nourished. No distress.  HENT:  Head: Normocephalic and atraumatic.  Nose: Nose normal.  Mouth/Throat: Oropharynx is clear and moist. No oropharyngeal exudate.  Eyes: Conjunctivae and EOM are normal. Pupils are equal, round, and reactive to light. No scleral  icterus.  Neck: Normal range of motion. Neck supple. Carotid bruit is not present. No thyromegaly present.  Cardiovascular: Normal rate, regular rhythm, normal heart sounds and intact distal pulses.   No murmur heard. Pulses:      Radial pulses are 2+ on the right side, and 2+ on the left side.  Pulmonary/Chest: Effort normal and breath sounds normal. No respiratory distress. He has no wheezes. He has no rales.  Abdominal: Soft.  Bowel sounds are normal. He exhibits no distension and no mass. There is no tenderness. There is no rebound and no guarding.  Central obesity No abd/renal bruit  Musculoskeletal: Normal range of motion. He exhibits no edema.  Lymphadenopathy:    He has no cervical adenopathy.  Neurological: He is alert and oriented to person, place, and time.  CN grossly intact, station and gait intact  Skin: Skin is warm and dry. No rash noted.  Psychiatric: He has a normal mood and affect. His behavior is normal. Judgment and thought content normal.       Assessment & Plan:

## 2012-12-10 NOTE — Addendum Note (Signed)
Addended by: Josph Macho A on: 12/10/2012 11:35 AM   Modules accepted: Orders

## 2012-12-13 ENCOUNTER — Encounter: Payer: Self-pay | Admitting: *Deleted

## 2014-07-23 ENCOUNTER — Other Ambulatory Visit: Payer: Self-pay | Admitting: Family Medicine

## 2014-07-23 DIAGNOSIS — E785 Hyperlipidemia, unspecified: Secondary | ICD-10-CM

## 2014-07-23 DIAGNOSIS — R739 Hyperglycemia, unspecified: Secondary | ICD-10-CM

## 2014-07-23 DIAGNOSIS — Z125 Encounter for screening for malignant neoplasm of prostate: Secondary | ICD-10-CM

## 2014-07-23 DIAGNOSIS — N400 Enlarged prostate without lower urinary tract symptoms: Secondary | ICD-10-CM

## 2014-07-26 ENCOUNTER — Other Ambulatory Visit (INDEPENDENT_AMBULATORY_CARE_PROVIDER_SITE_OTHER): Payer: BC Managed Care – PPO

## 2014-07-26 DIAGNOSIS — R739 Hyperglycemia, unspecified: Secondary | ICD-10-CM

## 2014-07-26 DIAGNOSIS — Z125 Encounter for screening for malignant neoplasm of prostate: Secondary | ICD-10-CM

## 2014-07-26 DIAGNOSIS — E785 Hyperlipidemia, unspecified: Secondary | ICD-10-CM

## 2014-07-26 LAB — COMPREHENSIVE METABOLIC PANEL
ALBUMIN: 4.1 g/dL (ref 3.5–5.2)
ALT: 30 U/L (ref 0–53)
AST: 26 U/L (ref 0–37)
Alkaline Phosphatase: 56 U/L (ref 39–117)
BUN: 16 mg/dL (ref 6–23)
CALCIUM: 9.4 mg/dL (ref 8.4–10.5)
CHLORIDE: 102 meq/L (ref 96–112)
CO2: 28 meq/L (ref 19–32)
Creatinine, Ser: 1.1 mg/dL (ref 0.4–1.5)
GFR: 70.78 mL/min (ref >60.00)
Glucose, Bld: 106 mg/dL — ABNORMAL HIGH (ref 70–99)
POTASSIUM: 4.8 meq/L (ref 3.5–5.1)
SODIUM: 137 meq/L (ref 135–145)
TOTAL PROTEIN: 7.3 g/dL (ref 6.0–8.3)
Total Bilirubin: 0.7 mg/dL (ref 0.2–1.2)

## 2014-07-26 LAB — LDL CHOLESTEROL, DIRECT: Direct LDL: 163.1 mg/dL

## 2014-07-26 LAB — LIPID PANEL
Cholesterol: 241 mg/dL — ABNORMAL HIGH (ref 0–200)
HDL: 34.8 mg/dL — ABNORMAL LOW (ref >39.00)
NonHDL: 206.2
Total CHOL/HDL Ratio: 7
Triglycerides: 226 mg/dL — ABNORMAL HIGH (ref 0.0–149.0)
VLDL: 45.2 mg/dL — ABNORMAL HIGH (ref 0.0–40.0)

## 2014-07-26 LAB — HEMOGLOBIN A1C: HEMOGLOBIN A1C: 6.5 % (ref 4.6–6.5)

## 2014-07-26 LAB — PSA: PSA: 0.81 ng/mL (ref 0.10–4.00)

## 2014-08-02 ENCOUNTER — Telehealth: Payer: Self-pay | Admitting: *Deleted

## 2014-08-02 ENCOUNTER — Encounter: Payer: Self-pay | Admitting: Family Medicine

## 2014-08-02 ENCOUNTER — Ambulatory Visit (INDEPENDENT_AMBULATORY_CARE_PROVIDER_SITE_OTHER): Payer: BC Managed Care – PPO | Admitting: Family Medicine

## 2014-08-02 VITALS — BP 150/90 | HR 88 | Temp 98.2°F | Ht 71.0 in | Wt 250.2 lb

## 2014-08-02 DIAGNOSIS — E785 Hyperlipidemia, unspecified: Secondary | ICD-10-CM

## 2014-08-02 DIAGNOSIS — N400 Enlarged prostate without lower urinary tract symptoms: Secondary | ICD-10-CM

## 2014-08-02 DIAGNOSIS — E119 Type 2 diabetes mellitus without complications: Secondary | ICD-10-CM

## 2014-08-02 DIAGNOSIS — E669 Obesity, unspecified: Secondary | ICD-10-CM

## 2014-08-02 DIAGNOSIS — Z8249 Family history of ischemic heart disease and other diseases of the circulatory system: Secondary | ICD-10-CM

## 2014-08-02 DIAGNOSIS — IMO0001 Reserved for inherently not codable concepts without codable children: Secondary | ICD-10-CM

## 2014-08-02 DIAGNOSIS — Z0000 Encounter for general adult medical examination without abnormal findings: Secondary | ICD-10-CM

## 2014-08-02 MED ORDER — ATORVASTATIN CALCIUM 40 MG PO TABS: 40.0000 mg | ORAL_TABLET | Freq: Every day | ORAL | Status: DC

## 2014-08-02 NOTE — Patient Instructions (Signed)
Start lipitor or atorvastatin 40mg  daily for cholesterol.  Work on diet changes and increased activity for goal weight loss to help sugars and cholesterol levels. We will call you in the next several weeks to schedule heart doctor evaluation and ultrasound for screening for abdominal aneurysm given family history. Good to see you today, call us with questions. Return as needed or in 6 months for follow up sugar and cholesterol, prior fasting for labs.

## 2014-08-02 NOTE — Progress Notes (Signed)
Pre visit review using our clinic review tool, if applicable. No additional management support is needed unless otherwise documented below in the visit note. 

## 2014-08-02 NOTE — Assessment & Plan Note (Signed)
Chronically elevated. Given new dx DM and strong fmhx CAD, will start statin and will refer to cards for further risk stratification per pt request.

## 2014-08-02 NOTE — Assessment & Plan Note (Signed)
Body mass index is 34.92 kg/(m^2).  Discussed healthy diet and lifestyle choices for goal weight loss.

## 2014-08-02 NOTE — Assessment & Plan Note (Signed)
Preventative protocols reviewed and updated unless pt declined. Discussed healthy diet and lifestyle.  

## 2014-08-02 NOTE — Assessment & Plan Note (Addendum)
New diagnosis. Reviewed A1c of 6.5% with patient - encouraged avoiding added sugars, simple carbs, and increasing regular exercise for goal weight loss.

## 2014-08-02 NOTE — Progress Notes (Signed)
BP 150/90 mmHg  Pulse 88  Temp(Src) 98.2 F (36.8 C) (Oral)  Ht 5\' 11"  (1.803 m)  Wt 250 lb 4 oz (113.513 kg)  BMI 34.92 kg/m2   CC: CPE  Subjective:    Patient ID: Eric Fitzgerald, male    DOB: 09/07/1960, 53 y.o.   MRN: [id number]  HPI: Eric Fitzgerald is a 53 y.o. male presenting on 08/02/2014 for Annual Exam   Elevated bp - today noted. Doesn't check at home. 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    Occasional chest discomfort but not exertional or relieved by rest. Does have fam hx CAD (Father, grandfather, uncle).   Snoring - wakes up to use bathroom several times a night. Sometimes wakes up gasping.   Father with h/o AAA - found at age 55, treated with stent. Asks about screening for aneurysm - last visit we reviewed different guidelines (USPSTF 55yo smoker, AHA 55yo with fmhx, vasc societies 55yo with fmhx). Pt was to check with insurance regarding coverage.   Preventative: Colonoscopy - 03/2010 normal, rpt 10 years. No family hx colon cancer  Prostate - nocturia x2-3at night. Strong stream. No fmhx prostate cancer, no h/o BPH. Discussed prostate screening. Desires yearly prostate screening.  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 Interim medical history since our last visit reviewed. Allergies and medications reviewed and updated.  No current outpatient prescriptions on file prior to visit.   No current facility-administered medications on file prior to visit.    Review of Systems  Constitutional: Negative for fever, chills, activity change, appetite change, fatigue and unexpected weight change.  HENT: Negative for hearing loss.   Eyes: Negative for visual disturbance.    Respiratory: Negative for cough, chest tightness, shortness of breath and wheezing.   Cardiovascular: Positive for chest pain (see HPI). Negative for palpitations and leg swelling.  Gastrointestinal: Negative for nausea, vomiting, abdominal pain, diarrhea, constipation, blood in stool and abdominal distention.  Genitourinary: Negative for hematuria and difficulty urinating.  Musculoskeletal: Negative for myalgias, arthralgias and neck pain.  Skin: Negative for rash.  Neurological: Negative for dizziness, seizures, syncope and headaches.  Hematological: Negative for adenopathy. Does not bruise/bleed easily.  Psychiatric/Behavioral: Negative for dysphoric mood. The patient is not nervous/anxious.    Per HPI unless specifically indicated above     Objective:    BP 150/90 mmHg  Pulse 88  Temp(Src) 98.2 F (36.8 C) (Oral)  Ht 5\' 11"  (1.803 m)  Wt 250 lb 4 oz (113.513 kg)  BMI 34.92 kg/m2  Wt Readings from Last 3 Encounters:  08/02/14 250 lb 4 oz (113.513 kg)  12/10/12 247 lb 4 oz (112.152 kg)  04/22/11 243 lb (110.224 kg)    Physical Exam  Constitutional: He is oriented to person, place, and time. He appears well-developed and well-nourished. No distress.  HENT:  Head: Normocephalic and atraumatic.  Right Ear: Hearing, tympanic membrane, external ear and ear canal normal.  Left Ear: Hearing, tympanic membrane, external ear and ear canal normal.  Nose: Nose normal.  Mouth/Throat: Uvula is midline, oropharynx is clear and moist and mucous membranes are normal. No oropharyngeal exudate, posterior oropharyngeal edema or posterior oropharyngeal erythema.  Eyes: Conjunctivae and EOM are normal. Pupils are equal, round, and reactive to light.  No scleral icterus.  Neck: Normal range of motion. Neck supple. No thyromegaly present.  Cardiovascular: Normal rate, regular rhythm, normal heart sounds and intact distal pulses.   No murmur heard. Pulses:      Radial pulses are 2+ on the right  side, and 2+ on the left side.  Pulmonary/Chest: Effort normal and breath sounds normal. No respiratory distress. He has no wheezes. He has no rales.  Abdominal: Soft. Bowel sounds are normal. He exhibits no distension and no mass. There is no tenderness. There is no rebound and no guarding.  Genitourinary: Rectum normal. Rectal exam shows no external hemorrhoid, no internal hemorrhoid, no fissure, no mass, no tenderness and anal tone normal. Prostate is enlarged (20-25gm). Prostate is not tender.  Musculoskeletal: Normal range of motion. He exhibits no edema.  Lymphadenopathy:    He has no cervical adenopathy.  Neurological: He is alert and oriented to person, place, and time.  CN grossly intact, station and gait intact  Skin: Skin is warm and dry. No rash noted.  Psychiatric: He has a normal mood and affect. His behavior is normal. Judgment and thought content normal.  Nursing note and vitals reviewed.  Results for orders placed or performed in visit on 07/26/14  Comprehensive metabolic panel  Result Value Ref Range   Sodium 137 135 - 145 mEq/L   Potassium 4.8 3.5 - 5.1 mEq/L   Chloride 102 96 - 112 mEq/L   CO2 28 19 - 32 mEq/L   Glucose, Bld 106 (H) 70 - 99 mg/dL   BUN 16 6 - 23 mg/dL   Creatinine, Ser 1.1 0.4 - 1.5 mg/dL   Total Bilirubin 0.7 0.2 - 1.2 mg/dL   Alkaline Phosphatase 56 39 - 117 U/L   AST 26 0 - 37 U/L   ALT 30 0 - 53 U/L   Total Protein 7.3 6.0 - 8.3 g/dL   Albumin 4.1 3.5 - 5.2 g/dL   Calcium 9.4 8.4 - 96.0 mg/dL   GFR 45.40 >98.11 mL/min  Lipid panel  Result Value Ref Range   Cholesterol 241 (H) 0 - 200 mg/dL   Triglycerides 914.7 (H) 0.0 - 149.0 mg/dL   HDL 82.95 (L) >62.13 mg/dL   VLDL 08.6 (H) 0.0 - 57.8 mg/dL   Total CHOL/HDL Ratio 7    NonHDL 206.20   Hemoglobin A1c  Result Value Ref Range   Hgb A1c MFr Bld 6.5 4.6 - 6.5 %  PSA  Result Value Ref Range   PSA 0.81 0.10 - 4.00 ng/mL  LDL cholesterol, direct  Result Value Ref Range   Direct LDL  163.1 mg/dL      Assessment & Plan:   Problem List Items Addressed This Visit    Obesity    Body mass index is 34.92 kg/(m^2).  Discussed healthy diet and lifestyle choices for goal weight loss.    HLD (hyperlipidemia)    Chronically elevated. Given new dx DM and strong fmhx CAD, will start statin and will refer to cards for further risk stratification per pt request.    Relevant Medications      atorvastatin (LIPITOR) tablet   Other Relevant Orders      Ambulatory referral to Cardiology   Healthcare maintenance - Primary    Preventative protocols reviewed and updated unless pt declined. Discussed healthy diet and lifestyle.     Diet-controlled diabetes mellitus    New diagnosis. Reviewed A1c of 6.5% with patient - encouraged avoiding added sugars, simple carbs, and increasing regular  exercise for goal weight loss.    Relevant Medications      atorvastatin (LIPITOR) tablet   BPH (benign prostatic hypertrophy)    Again noted today. Stable sxs.     Other Visit Diagnoses    AAA family hx        Relevant Orders       Abdominal Aortic Aneurysm duplex    Family history of premature CAD        Relevant Orders       Ambulatory referral to Cardiology        Follow up plan: Return in about 6 months (around 02/01/2015), or as needed, for follow up visit.

## 2014-08-02 NOTE — Assessment & Plan Note (Signed)
Again noted today. Stable sxs.

## 2014-08-02 NOTE — Telephone Encounter (Signed)
Called patient after OV to remind him to check BP at local pharmacy and if consistently >140/90 to call and let me know. He verbalized understanding.

## 2014-08-04 DIAGNOSIS — Z9989 Dependence on other enabling machines and devices: Secondary | ICD-10-CM

## 2014-08-04 DIAGNOSIS — G4733 Obstructive sleep apnea (adult) (pediatric): Secondary | ICD-10-CM

## 2014-08-04 HISTORY — DX: Obstructive sleep apnea (adult) (pediatric): G47.33

## 2014-08-04 HISTORY — DX: Dependence on other enabling machines and devices: Z99.89

## 2014-08-10 ENCOUNTER — Ambulatory Visit (HOSPITAL_COMMUNITY): Payer: BLUE CROSS/BLUE SHIELD | Attending: Cardiology | Admitting: Cardiology

## 2014-08-10 DIAGNOSIS — R109 Unspecified abdominal pain: Secondary | ICD-10-CM | POA: Diagnosis present

## 2014-08-10 DIAGNOSIS — I159 Secondary hypertension, unspecified: Secondary | ICD-10-CM

## 2014-08-10 DIAGNOSIS — Z8249 Family history of ischemic heart disease and other diseases of the circulatory system: Secondary | ICD-10-CM | POA: Insufficient documentation

## 2014-08-10 DIAGNOSIS — E785 Hyperlipidemia, unspecified: Secondary | ICD-10-CM | POA: Insufficient documentation

## 2014-08-10 DIAGNOSIS — I10 Essential (primary) hypertension: Secondary | ICD-10-CM | POA: Insufficient documentation

## 2014-08-10 DIAGNOSIS — IMO0001 Reserved for inherently not codable concepts without codable children: Secondary | ICD-10-CM

## 2014-08-10 NOTE — Progress Notes (Signed)
Abdominal Aorta Duplex performed  

## 2014-08-14 ENCOUNTER — Encounter: Payer: Self-pay | Admitting: *Deleted

## 2014-08-28 ENCOUNTER — Ambulatory Visit (INDEPENDENT_AMBULATORY_CARE_PROVIDER_SITE_OTHER): Payer: BLUE CROSS/BLUE SHIELD | Admitting: Interventional Cardiology

## 2014-08-28 ENCOUNTER — Encounter: Payer: Self-pay | Admitting: Interventional Cardiology

## 2014-08-28 VITALS — BP 140/82 | HR 77 | Ht 71.0 in | Wt 245.0 lb

## 2014-08-28 DIAGNOSIS — R0789 Other chest pain: Secondary | ICD-10-CM

## 2014-08-28 DIAGNOSIS — R0989 Other specified symptoms and signs involving the circulatory and respiratory systems: Secondary | ICD-10-CM

## 2014-08-28 DIAGNOSIS — E119 Type 2 diabetes mellitus without complications: Secondary | ICD-10-CM

## 2014-08-28 DIAGNOSIS — E669 Obesity, unspecified: Secondary | ICD-10-CM

## 2014-08-28 DIAGNOSIS — E785 Hyperlipidemia, unspecified: Secondary | ICD-10-CM

## 2014-08-28 MED ORDER — ATORVASTATIN CALCIUM 40 MG PO TABS: 40.0000 mg | ORAL_TABLET | Freq: Every day | ORAL | Status: DC

## 2014-08-28 MED ORDER — ASPIRIN EC 81 MG PO TBEC: 81.0000 mg | DELAYED_RELEASE_TABLET | Freq: Every day | ORAL | Status: DC

## 2014-08-28 NOTE — Patient Instructions (Signed)
Your physician has recommended you make the following change in your medication:  1) START taking ASA 81mg  daily  Your physician recommends that you schedule to have an Exercise Myoview.  Your physician recommends that you schedule a follow-up appointment based on the results of your myoview.

## 2014-08-28 NOTE — Progress Notes (Signed)
Patient ID: Eric Fitzgerald, male   DOB: 02/22/1961, 54 y.o.   MRN: [id number]     Patient ID: Eric Fitzgerald MRN: [id number] DOB/AGE: 56/10/1960 54 y.o.   Referring Physician Dr. Sharen HonesGutierrez   Reason for Consultation chest discomfort  HPI: 54 y/o who had chest tightness 6 weeks ago.  He was under a lot of stress at work.  Episodes were daily.  Worse with exertion at times.  He has played tennis at times and felt better, without any chest pain.  Felt like it was difficult to get a deep breath.  His father had CABG at age 56.  Maternal Grandfather had CABG at 257.    He has been trying to eat better and [facility]  He walks and does the elliptical or 15 minutes- interval training.  He is increasing intensity.  He has played tennis and had no problems in the last few weeks.  Blood sugar has been increasing.  It will be checked again in 6 months.  Weight loss was encouraged for several reasons.   He has been told that he has sx of sleep apnea.    He was just started on atorvastatin in the last month.  Mildly elevated blood pressure at his last visit.  Nonsmoker.    Current Outpatient Prescriptions  Medication Sig Dispense Refill  . atorvastatin (LIPITOR) 40 MG tablet Take 1 tablet (40 mg total) by mouth daily. 30 tablet 11   No current facility-administered medications for this visit.   Past Medical History  Diagnosis Date  . HLD (hyperlipidemia)     Family History  Problem Relation Age of Onset  . CAD Father 450    5v CABG  . Other Mother 6857    Brain tumor  . Bipolar disorder Sister   . Depression Sister   . CAD Maternal Grandfather     MI  . CAD Maternal Uncle     MI  . AAA (abdominal aortic aneurysm) Father     smoker  . Stroke Neg Hx   . Diabetes Neg Hx     History   Social History  . Marital Status: Married    Spouse Name: N/A    Number of Children: 2  . Years of Education: N/A   Occupational History  . Sales-office equipment    Social History  Main Topics  . Smoking status: Never Smoker   . Smokeless tobacco: Never Used  . Alcohol Use: Yes     Comment: Rare  . Drug Use: No  . Sexual Activity: Not on file   Other Topics Concern  . Not on file   Social History Narrative   Married; lives with wife   2 children at home   Office Equipment salesman   Activity: no regular activity   Diet: good water, daily fruits/vegetables    Past Surgical History  Procedure Laterality Date  . Knee arthroscopy  1981    Right      (Not in a hospital admission)  Review of systems complete and found to be negative unless listed above .  No nausea, vomiting.  No fever chills, No focal weakness,  No palpitations.  Physical Exam: Filed Vitals:   08/28/14 1039  BP: 140/82  Pulse: 77    Weight: 245 lb (111.131 kg)  Physical exam: No apparent distress [place]/AT EOMI No JVD, soft right carotid bruit RRR S1S2  No wheezing Soft. NT, nondistended No edema. No focal motor or sensory deficits Normal  affect  Labs:   Lab Results  Component Value Date   WBC 6.9 01/03/2010   HGB 15.1 01/03/2010   HCT 44.4 01/03/2010   MCV 91.6 01/03/2010   PLT 281.0 01/03/2010   No results for input(s): NA, K, CL, CO2, BUN, CREATININE, CALCIUM, PROT, BILITOT, ALKPHOS, ALT, AST, GLUCOSE in the last 168 hours.  Invalid input(s): LABALBU No results found for: CKTOTAL, CKMB, CKMBINDEX, TROPONINI Lab Results  Component Value Date   CHOL 241* 07/26/2014   CHOL 234* 12/10/2012   CHOL 226* 04/17/2011   Lab Results  Component Value Date   HDL 34.80* 07/26/2014   HDL 36.40* 12/10/2012   HDL 40.00 04/17/2011   No results found for: [facility] Lab Results  Component Value Date   TRIG 226.0* 07/26/2014   TRIG 214.0* 12/10/2012   TRIG 206.0* 04/17/2011   Lab Results  Component Value Date   CHOLHDL 7 07/26/2014   CHOLHDL 6 12/10/2012   CHOLHDL 6 04/17/2011   Lab Results  Component Value Date   LDLDIRECT 163.1 07/26/2014   LDLDIRECT 161.0 12/10/2012     LDLDIRECT 160.8 04/17/2011      Radiology: EKG: Normal sinus rhythm, Q waves in lead 3 with 1 mm ST segment depression, downsloping. 1 mm ST segment depressions in lead V5 and V6, flat  ASSESSMENT AND PLAN:  1) chest discomfort: Some typical features. He has multiple risk factors for heart disease including impaired glucose tolerance, which is now being managed with attempts at diet control and weight loss. He also has hyperlipidemia. Strong family history of early coronary artery disease in the family. Given all of these and his symptoms, will plan for nuclear exercise stress test. I don't think his ECG will be interpretable. He does understand that if his stress test is abnormal, the next step would be cardiac catheterization.  2) obesity: Continue attempts at weight loss. He has lost about 5 pounds over the last several weeks of lifestyle changes. I encouraged him to continue. This will help both his lipids as well as his blood sugar.  3) carotid Doppler to evaluate for carotid atherosclerosis. Signed:   Fredric Mare, MD, [facility] 08/28/2014, 11:34 AM

## 2014-08-29 ENCOUNTER — Ambulatory Visit (HOSPITAL_COMMUNITY): Payer: BLUE CROSS/BLUE SHIELD | Attending: Cardiology | Admitting: Radiology

## 2014-08-29 ENCOUNTER — Ambulatory Visit (HOSPITAL_BASED_OUTPATIENT_CLINIC_OR_DEPARTMENT_OTHER): Payer: BLUE CROSS/BLUE SHIELD | Admitting: Cardiology

## 2014-08-29 DIAGNOSIS — Z8249 Family history of ischemic heart disease and other diseases of the circulatory system: Secondary | ICD-10-CM | POA: Diagnosis not present

## 2014-08-29 DIAGNOSIS — R079 Chest pain, unspecified: Secondary | ICD-10-CM | POA: Insufficient documentation

## 2014-08-29 DIAGNOSIS — R0989 Other specified symptoms and signs involving the circulatory and respiratory systems: Secondary | ICD-10-CM

## 2014-08-29 DIAGNOSIS — R0609 Other forms of dyspnea: Secondary | ICD-10-CM | POA: Diagnosis not present

## 2014-08-29 DIAGNOSIS — E785 Hyperlipidemia, unspecified: Secondary | ICD-10-CM | POA: Diagnosis not present

## 2014-08-29 DIAGNOSIS — R0789 Other chest pain: Secondary | ICD-10-CM

## 2014-08-29 MED ORDER — TECHNETIUM TC 99M SESTAMIBI GENERIC - CARDIOLITE
30.0000 | Freq: Once | INTRAVENOUS | Status: AC | PRN
  Administered 2014-08-29: 30 via INTRAVENOUS

## 2014-08-29 MED ORDER — TECHNETIUM TC 99M SESTAMIBI GENERIC - CARDIOLITE
10.0000 | Freq: Once | INTRAVENOUS | Status: AC | PRN
  Administered 2014-08-29: 10 via INTRAVENOUS

## 2014-08-29 NOTE — Progress Notes (Signed)
Carotid duplex performed 

## 2014-08-29 NOTE — Progress Notes (Signed)
[facility] SITE 3 NUCLEAR MED [address] [place], [place] [address] [phone]    Cardiology Nuclear Med Study  Eric Fitzgerald is a 54 y.o. male     MRN : [id number]     DOB: 08/02/1961  Procedure Date: 08/29/2014  Nuclear Med Background Indication for Stress Test:  Evaluation for Ischemia History: No prior known history of CAD Cardiac Risk Factors: Family History - CAD and Lipids  Symptoms: Chest Pain with/without exertion (last occurrence 4 weeks ago) DOE   Nuclear Pre-Procedure Caffeine/Decaff Intake:  None> 12 hrs NPO After: 9:30pm   Lungs:  clear O2 Sat: 97% on room air. IV 0.9% NS with Angio Cath:  22g  IV Site: R Hand, tolerated well IV Started by:  Irean HongPatsy Edwards, RN  Chest Size (in):  46 Cup Size: n/a  Height: 5\' 11"  (1.803 m)  Weight:  243 lb (110.224 kg)  BMI:  Body mass index is 33.91 kg/(m^2). Tech Comments:  N/A    Nuclear Med Study 1 or 2 day study: 1 day  Stress Test Type:  Stress  Reading MD: N/A  Order Authorizing Provider:  Lance MussJayadeep Varanasi, MD  Resting Radionuclide: Technetium 7969m Sestamibi  Resting Radionuclide Dose: 11.0 mCi   Stress Radionuclide:  Technetium 5569m Sestamibi  Stress Radionuclide Dose: 33.0 mCi           Stress Protocol Rest HR: 68 Stress HR: 151  Rest BP: 131/81 Stress BP: 220/81  Exercise Time (min): 7:01 METS: 8.5   Predicted Max HR: 165 bpm % Max HR: 91.52 bpm Rate Pressure Product: [id number]   Dose of Adenosine (mg):  n/a Dose of Lexiscan: n/a mg  Dose of Atropine (mg): n/a Dose of Dobutamine: n/a mcg/kg/min (at max HR)  Stress Test Technologist: Irean HongPatsy Edwards, RN  Nuclear Technologist:  Kerby NoraElzbieta Kubak, CNMT     Rest Procedure:  Myocardial perfusion imaging was performed at rest 45 minutes following the intravenous administration of Technetium 2069m Sestamibi. Rest ECG: NSR - Normal EKG  Stress Procedure:  The patient exercised on the treadmill utilizing the Bruce Protocol for 7:01 minutes, RPE=15. The patient  stopped due to DOE and denied any chest pain. The patient had a hypertensive response to exercise. Technetium 3969m Sestamibi was injected at peak exercise and myocardial perfusion imaging was performed after a brief delay. Stress ECG: No significant ST segment change suggestive of ischemia.  QPS Raw Data Images:  Normal uptake Stress Images:  Normal homogeneous uptake in all areas of the myocardium. Rest Images:  Normal homogeneous uptake in all areas of the myocardium. Subtraction (SDS):  No evidence of ischemia. Transient Ischemic Dilatation (Normal <1.22):  0.90 Lung/Heart Ratio (Normal <0.45):  0.37  Quantitative Gated Spect Images QGS EDV:  86 ml QGS ESV:  29 ml  Impression Exercise Capacity:  Fair exercise capacity. BP Response:  Hypertensive blood pressure response. Clinical Symptoms:  There is dyspnea. ECG Impression:  No significant ST segment change suggestive of ischemia. Comparison with Prior Nuclear Study: No previous nuclear study performed  Overall Impression:  Normal stress nuclear study.  LV Ejection Fraction: 66%.  LV Wall Motion:  NL LV Function; NL Wall Motion   Olga MillersBrian Maritza Hosterman

## 2014-09-28 ENCOUNTER — Ambulatory Visit (INDEPENDENT_AMBULATORY_CARE_PROVIDER_SITE_OTHER): Payer: BLUE CROSS/BLUE SHIELD | Admitting: Family Medicine

## 2014-09-28 ENCOUNTER — Encounter: Payer: Self-pay | Admitting: Family Medicine

## 2014-09-28 VITALS — BP 130/76 | HR 64 | Temp 98.5°F | Wt 248.5 lb

## 2014-09-28 DIAGNOSIS — B9789 Other viral agents as the cause of diseases classified elsewhere: Principal | ICD-10-CM

## 2014-09-28 DIAGNOSIS — J069 Acute upper respiratory infection, unspecified: Secondary | ICD-10-CM

## 2014-09-28 LAB — POCT INFLUENZA A/B
INFLUENZA A, POC: NEGATIVE
Influenza B, POC: NEGATIVE

## 2014-09-28 NOTE — Progress Notes (Signed)
BP 130/76 mmHg  Pulse 64  Temp(Src) 98.5 F (36.9 C) (Oral)  Wt 248 lb 8 oz (112.719 kg)   CC: head congestion  Subjective:    Patient ID: Eric Fitzgerald, male    DOB: 1960-09-09, 54 y.o.   MRN: [id number]  HPI: Eric Fitzgerald is a 54 y.o. male presenting on 09/28/2014 for Head Conegestion   3-4d h/o malaise that started as myalgias, arthralgias, head congestion and rhinorrhea, mild cough, PNdrainage. Myalgias are main concern. +some fevers/chills.  No ear or tooth pain, ST, headaches. So far has tried advil sinus relief which helped.   Father hospitalized this past weekend with ankle replacement. Not around smokers.  No h/o asthma.  Did receive flu shot this year.   Relevant past medical, surgical, family and social history reviewed and updated as indicated. Interim medical history since our last visit reviewed. Allergies and medications reviewed and updated. Current Outpatient Prescriptions on File Prior to Visit  Medication Sig  . aspirin EC 81 MG tablet Take 1 tablet (81 mg total) by mouth daily.  Marland Kitchen atorvastatin (LIPITOR) 40 MG tablet Take 1 tablet (40 mg total) by mouth daily.   No current facility-administered medications on file prior to visit.    Review of Systems Per HPI unless specifically indicated above     Objective:    BP 130/76 mmHg  Pulse 64  Temp(Src) 98.5 F (36.9 C) (Oral)  Wt 248 lb 8 oz (112.719 kg)  Wt Readings from Last 3 Encounters:  09/28/14 248 lb 8 oz (112.719 kg)  08/29/14 243 lb (110.224 kg)  08/28/14 245 lb (111.131 kg)    Physical Exam  Constitutional: He appears well-developed and well-nourished. No distress.  HENT:  Head: Normocephalic and atraumatic.  Right Ear: Hearing, tympanic membrane, external ear and ear canal normal.  Left Ear: Hearing, tympanic membrane, external ear and ear canal normal.  Nose: Mucosal edema (mild nasal mucosal congestion) present. No rhinorrhea. Right sinus exhibits no maxillary sinus tenderness  and no frontal sinus tenderness. Left sinus exhibits no maxillary sinus tenderness and no frontal sinus tenderness.  Mouth/Throat: Uvula is midline, oropharynx is clear and moist and mucous membranes are normal. No oropharyngeal exudate, posterior oropharyngeal edema, posterior oropharyngeal erythema or tonsillar abscesses.  Eyes: Conjunctivae and EOM are normal. Pupils are equal, round, and reactive to light. No scleral icterus.  Neck: Normal range of motion. Neck supple.  Cardiovascular: Normal rate, regular rhythm, normal heart sounds and intact distal pulses.   No murmur heard. Pulmonary/Chest: Effort normal and breath sounds normal. No respiratory distress. He has no wheezes. He has no rales.  Lymphadenopathy:    He has no cervical adenopathy.  Skin: Skin is warm and dry. No rash noted.  Nursing note and vitals reviewed.  Results for orders placed or performed in visit on 07/26/14  Comprehensive metabolic panel  Result Value Ref Range   Sodium 137 135 - 145 mEq/L   Potassium 4.8 3.5 - 5.1 mEq/L   Chloride 102 96 - 112 mEq/L   CO2 28 19 - 32 mEq/L   Glucose, Bld 106 (H) 70 - 99 mg/dL   BUN 16 6 - 23 mg/dL   Creatinine, Ser 1.1 0.4 - 1.5 mg/dL   Total Bilirubin 0.7 0.2 - 1.2 mg/dL   Alkaline Phosphatase 56 39 - 117 U/L   AST 26 0 - 37 U/L   ALT 30 0 - 53 U/L   Total Protein 7.3 6.0 - 8.3 g/dL  Albumin 4.1 3.5 - 5.2 g/dL   Calcium 9.4 8.4 - 16.110.5 mg/dL   GFR 09.6070.78 >45.40>60.00 mL/min  Lipid panel  Result Value Ref Range   Cholesterol 241 (H) 0 - 200 mg/dL   Triglycerides 981.1226.0 (H) 0.0 - 149.0 mg/dL   HDL 91.4734.80 (L) >82.95>39.00 mg/dL   VLDL 62.145.2 (H) 0.0 - 30.840.0 mg/dL   Total CHOL/HDL Ratio 7    NonHDL 206.20   Hemoglobin A1c  Result Value Ref Range   Hgb A1c MFr Bld 6.5 4.6 - 6.5 %  PSA  Result Value Ref Range   PSA 0.81 0.10 - 4.00 ng/mL  LDL cholesterol, direct  Result Value Ref Range   Direct LDL 163.1 mg/dL      Assessment & Plan:   Problem List Items Addressed This Visit      Viral URI with cough - Primary    Anticipate viral given short duration. Push fluids and rest Supportive care as per instructions. Flu swab negative Red flags to return or seek further care discussed.          Follow up plan: Return if symptoms worsen or fail to improve.

## 2014-09-28 NOTE — Addendum Note (Signed)
Addended by: Josph MachoANCE, KIMBERLY A on: 09/28/2014 05:15 PM   Modules accepted: Orders

## 2014-09-28 NOTE — Patient Instructions (Signed)
You have a viral upper respiratory infection. Antibiotics are not needed for this.  Viral infections usually take 7-10 days to resolve.  The cough can last a few weeks to go away. Push fluids and plenty of rest. May use plain mucinex with plenty of water to help mobilize mucous, continue advil sinus as needed. Please return if you are not improving as expected, or if you have high fevers (>101.5) or difficulty swallowing or worsening productive cough. Call clinic with questions.  Good to see you today. I hope you start feeling better soon.

## 2014-09-28 NOTE — Assessment & Plan Note (Signed)
Anticipate viral given short duration. Push fluids and rest Supportive care as per instructions. Flu swab negative Red flags to return or seek further care discussed.

## 2014-09-28 NOTE — Progress Notes (Signed)
Pre visit review using our clinic review tool, if applicable. No additional management support is needed unless otherwise documented below in the visit note. 

## 2015-01-25 ENCOUNTER — Other Ambulatory Visit: Payer: Self-pay | Admitting: Family Medicine

## 2015-01-25 DIAGNOSIS — E119 Type 2 diabetes mellitus without complications: Secondary | ICD-10-CM

## 2015-01-25 DIAGNOSIS — E785 Hyperlipidemia, unspecified: Secondary | ICD-10-CM

## 2015-01-29 ENCOUNTER — Other Ambulatory Visit: Payer: BC Managed Care – PPO

## 2015-01-29 ENCOUNTER — Other Ambulatory Visit (INDEPENDENT_AMBULATORY_CARE_PROVIDER_SITE_OTHER): Payer: BLUE CROSS/BLUE SHIELD

## 2015-01-29 DIAGNOSIS — E785 Hyperlipidemia, unspecified: Secondary | ICD-10-CM | POA: Diagnosis not present

## 2015-01-29 DIAGNOSIS — E119 Type 2 diabetes mellitus without complications: Secondary | ICD-10-CM | POA: Diagnosis not present

## 2015-01-29 LAB — LIPID PANEL
Cholesterol: 135 mg/dL (ref 0–200)
HDL: 35.8 mg/dL — ABNORMAL LOW (ref >39.00)
LDL Cholesterol: 77 mg/dL (ref 0–99)
NonHDL: 99.2
Total CHOL/HDL Ratio: 4
Triglycerides: 109 mg/dL (ref 0.0–149.0)
VLDL: 21.8 mg/dL (ref 0.0–40.0)

## 2015-01-29 LAB — BASIC METABOLIC PANEL
BUN: 18 mg/dL (ref 6–23)
CALCIUM: 9 mg/dL (ref 8.4–10.5)
CHLORIDE: 104 meq/L (ref 96–112)
CO2: 29 mEq/L (ref 19–32)
Creatinine, Ser: 1.11 mg/dL (ref 0.40–1.50)
GFR: 72.86 mL/min (ref >60.00)
Glucose, Bld: 108 mg/dL — ABNORMAL HIGH (ref 70–99)
Potassium: 4.3 mEq/L (ref 3.5–5.1)
Sodium: 137 mEq/L (ref 135–145)

## 2015-01-29 LAB — HEMOGLOBIN A1C: Hgb A1c MFr Bld: 6 % (ref 4.6–6.5)

## 2015-01-29 LAB — MICROALBUMIN / CREATININE URINE RATIO
CREATININE, U: 286 mg/dL
MICROALB UR: 0.9 mg/dL (ref 0.0–1.9)
Microalb Creat Ratio: 0.3 mg/g (ref 0.0–30.0)

## 2015-01-29 NOTE — Addendum Note (Signed)
Addended by: Liane ComberHAVERS, NATASHA C on: 01/29/2015 11:17 AM   Modules accepted: Orders

## 2015-02-01 ENCOUNTER — Ambulatory Visit (INDEPENDENT_AMBULATORY_CARE_PROVIDER_SITE_OTHER): Payer: BLUE CROSS/BLUE SHIELD | Admitting: Family Medicine

## 2015-02-01 ENCOUNTER — Encounter: Payer: Self-pay | Admitting: Family Medicine

## 2015-02-01 VITALS — BP 140/84 | HR 72 | Temp 98.2°F | Wt 249.2 lb

## 2015-02-01 DIAGNOSIS — R030 Elevated blood-pressure reading, without diagnosis of hypertension: Secondary | ICD-10-CM | POA: Diagnosis not present

## 2015-02-01 DIAGNOSIS — R7303 Prediabetes: Secondary | ICD-10-CM

## 2015-02-01 DIAGNOSIS — E785 Hyperlipidemia, unspecified: Secondary | ICD-10-CM | POA: Diagnosis not present

## 2015-02-01 DIAGNOSIS — R0683 Snoring: Secondary | ICD-10-CM

## 2015-02-01 DIAGNOSIS — R7309 Other abnormal glucose: Secondary | ICD-10-CM | POA: Diagnosis not present

## 2015-02-01 NOTE — Progress Notes (Signed)
BP 140/84 mmHg  Pulse 72  Temp(Src) 98.2 F (36.8 C) (Oral)  Wt 249 lb 4 oz (113.059 kg)   CC: 6 mo f/u visit  Subjective:    Patient ID: Eric Fitzgerald, male    DOB: May 08, 1961, 54 y.o.   MRN: [id number]  HPI: VISHNU MOELLER is a 54 y.o. male presenting on 02/01/2015 for Follow-up   Last visit sent to cardiology for chest discomfort - s/p normal stress test. rec update cards if persistent sxs. Also noted new diagnosis of diabetes with A1c 6.5%. Carotid US and AAA screening US returned normal.  Here today for 6 mo f/u visit.   Weight gain noted but improvement in blood pressure, sugars and chol levels noted as well. He has been exercising a little bit and decreasing sugars in diet. Started playing tennis.  Wonders about sleep apnea - waking up gasping for breath, snores. Some apnea. Wakes up not feeling rested. + daytime sleepiness. Requests referral for sleep study  Relevant past medical, surgical, family and social history reviewed and updated as indicated. Interim medical history since our last visit reviewed. Allergies and medications reviewed and updated. Current Outpatient Prescriptions on File Prior to Visit  Medication Sig  . aspirin EC 81 MG tablet Take 1 tablet (81 mg total) by mouth daily.  Marland Kitchen atorvastatin (LIPITOR) 40 MG tablet Take 1 tablet (40 mg total) by mouth daily.   No current facility-administered medications on file prior to visit.    Review of Systems Per HPI unless specifically indicated above     Objective:    BP 140/84 mmHg  Pulse 72  Temp(Src) 98.2 F (36.8 C) (Oral)  Wt 249 lb 4 oz (113.059 kg)  Wt Readings from Last 3 Encounters:  02/01/15 249 lb 4 oz (113.059 kg)  09/28/14 248 lb 8 oz (112.719 kg)  08/29/14 243 lb (110.224 kg)    Physical Exam  Constitutional: He appears well-developed and well-nourished. No distress.  HENT:  Head: Normocephalic and atraumatic.  Right Ear: External ear normal.  Left Ear: External ear normal.    Nose: Nose normal.  Mouth/Throat: Oropharynx is clear and moist. No oropharyngeal exudate.  Eyes: Conjunctivae and EOM are normal. Pupils are equal, round, and reactive to light. No scleral icterus.  Neck: Normal range of motion. Neck supple.  Cardiovascular: Normal rate, regular rhythm, normal heart sounds and intact distal pulses.   No murmur heard. Pulmonary/Chest: Effort normal and breath sounds normal. No respiratory distress. He has no wheezes. He has no rales.  Musculoskeletal: He exhibits no edema.  See HPI for foot exam if done  Lymphadenopathy:    He has no cervical adenopathy.  Skin: Skin is warm and dry. No rash noted.  Psychiatric: He has a normal mood and affect.  Nursing note and vitals reviewed.  Results for orders placed or performed in visit on 01/29/15  Lipid panel  Result Value Ref Range   Cholesterol 135 0 - 200 mg/dL   Triglycerides 782.9 0.0 - 149.0 mg/dL   HDL 56.21 (L) >30.86 mg/dL   VLDL 57.8 0.0 - 46.9 mg/dL   LDL Cholesterol 77 0 - 99 mg/dL   Total CHOL/HDL Ratio 4    NonHDL 99.20   Hemoglobin A1c  Result Value Ref Range   Hgb A1c MFr Bld 6.0 4.6 - 6.5 %  Basic metabolic panel  Result Value Ref Range   Sodium 137 135 - 145 mEq/L   Potassium 4.3 3.5 - 5.1 mEq/L  Chloride 104 96 - 112 mEq/L   CO2 29 19 - 32 mEq/L   Glucose, Bld 108 (H) 70 - 99 mg/dL   BUN 18 6 - 23 mg/dL   Creatinine, Ser 1.911.11 0.40 - 1.50 mg/dL   Calcium 9.0 8.4 - 47.810.5 mg/dL   GFR 29.5672.86 >21.30>60.00 mL/min  Microalbumin / creatinine urine ratio  Result Value Ref Range   Microalb, Ur 0.9 0.0 - 1.9 mg/dL   Creatinine,U 865.7286.0 mg/dL   Microalb Creat Ratio 0.3 0.0 - 30.0 mg/g      Assessment & Plan:   Problem List Items Addressed This Visit    Elevated blood pressure reading without diagnosis of hypertension    Again elevated today. Will further eval for OSA - and if present consider treatment which may improve mild hypertension. Did not start meds today.      HLD  (hyperlipidemia)    Reviewed marked improvement noted. Continue statin.      Prediabetes - Primary    Congratulated on improved A1c attributed to healthier diet. Encouraged continued efforts at weight loss. Labs reviewed with patient.      Snoring    Endorses several OSA sxs - requests referral for pulm which I agree with. Referred today.      Relevant Orders   Ambulatory referral to Pulmonology       Follow up plan: Return in about 6 months (around 08/03/2015), or as needed, for annual exam, prior fasting for blood work.

## 2015-02-01 NOTE — Assessment & Plan Note (Signed)
Congratulated on improved A1c attributed to healthier diet. Encouraged continued efforts at weight loss. Labs reviewed with patient.

## 2015-02-01 NOTE — Assessment & Plan Note (Signed)
Endorses several OSA sxs - requests referral for pulm which I agree with. Referred today.

## 2015-02-01 NOTE — Assessment & Plan Note (Signed)
Again elevated today. Will further eval for OSA - and if present consider treatment which may improve mild hypertension. Did not start meds today.

## 2015-02-01 NOTE — Patient Instructions (Addendum)
Pass by [facility] for referral for sleep doctor. Congratulations on healthy diet changes - keep it up.  Incorporate more regular exercise into routine.  Goal 150 min/week of moderate intensity aerobic exercise. Return in 6 months for physical, sooner if needed.

## 2015-02-01 NOTE — Progress Notes (Signed)
Pre visit review using our clinic review tool, if applicable. No additional management support is needed unless otherwise documented below in the visit note. 

## 2015-02-01 NOTE — Assessment & Plan Note (Signed)
Reviewed marked improvement noted. Continue statin.

## 2015-04-24 ENCOUNTER — Telehealth: Payer: Self-pay | Admitting: Family Medicine

## 2015-04-24 NOTE — Telephone Encounter (Signed)
Spoke with patient. He lives about 3.5 hours away now. He is going to call me back with what he decides to do whether come here or just get things done where he lives.

## 2015-04-24 NOTE — Telephone Encounter (Signed)
Received request for work certification form. Would suggest pt come in for appt - as will need vision and hearing checked, and will need to discuss immunization status and likely draw titers if doesn't have immunization record available. Form in Kim's box.

## 2015-05-10 ENCOUNTER — Institutional Professional Consult (permissible substitution): Payer: BLUE CROSS/BLUE SHIELD | Admitting: Internal Medicine

## 2015-05-17 LAB — COMPLETE METABOLIC PANEL WITH GFR
ALBUMIN: 4.4
ALK PHOS: 70 U/L
ALT: 30
AST: 20 U/L
BILIRUBIN, TOTAL: 0.7
CREATININE: 1.05
GLUCOSE: 95

## 2015-05-17 LAB — PSA: PSA: 0.7

## 2015-08-06 ENCOUNTER — Other Ambulatory Visit: Payer: Self-pay | Admitting: Family Medicine

## 2015-08-06 DIAGNOSIS — E785 Hyperlipidemia, unspecified: Secondary | ICD-10-CM

## 2015-08-06 DIAGNOSIS — N400 Enlarged prostate without lower urinary tract symptoms: Secondary | ICD-10-CM

## 2015-08-06 DIAGNOSIS — R7303 Prediabetes: Secondary | ICD-10-CM

## 2015-08-07 ENCOUNTER — Other Ambulatory Visit: Payer: BLUE CROSS/BLUE SHIELD

## 2015-08-10 ENCOUNTER — Encounter: Payer: BLUE CROSS/BLUE SHIELD | Admitting: Family Medicine

## 2015-09-05 HISTORY — PX: KNEE ARTHROSCOPY: SUR90

## 2016-04-21 ENCOUNTER — Other Ambulatory Visit (INDEPENDENT_AMBULATORY_CARE_PROVIDER_SITE_OTHER): Payer: BLUE CROSS/BLUE SHIELD

## 2016-04-21 DIAGNOSIS — N400 Enlarged prostate without lower urinary tract symptoms: Secondary | ICD-10-CM | POA: Diagnosis not present

## 2016-04-21 DIAGNOSIS — E785 Hyperlipidemia, unspecified: Secondary | ICD-10-CM

## 2016-04-21 DIAGNOSIS — R7303 Prediabetes: Secondary | ICD-10-CM | POA: Diagnosis not present

## 2016-04-21 LAB — LIPID PANEL
Cholesterol: 127 mg/dL (ref 0–200)
HDL: 39.8 mg/dL (ref >39.00)
LDL CALC: 67 mg/dL (ref 0–99)
NONHDL: 87.21
Total CHOL/HDL Ratio: 3
Triglycerides: 99 mg/dL (ref 0.0–149.0)
VLDL: 19.8 mg/dL (ref 0.0–40.0)

## 2016-04-21 LAB — BASIC METABOLIC PANEL
BUN: 16 mg/dL (ref 6–23)
CO2: 30 mEq/L (ref 19–32)
Calcium: 9.1 mg/dL (ref 8.4–10.5)
Chloride: 103 mEq/L (ref 96–112)
Creatinine, Ser: 0.98 mg/dL (ref 0.40–1.50)
GFR: 83.75 mL/min (ref >60.00)
GLUCOSE: 104 mg/dL — AB (ref 70–99)
Potassium: 4.8 mEq/L (ref 3.5–5.1)
Sodium: 139 mEq/L (ref 135–145)

## 2016-04-21 LAB — HEMOGLOBIN A1C: Hgb A1c MFr Bld: 6.3 % (ref 4.6–6.5)

## 2016-04-21 LAB — PSA: PSA: 0.9 ng/mL (ref 0.10–4.00)

## 2016-04-24 ENCOUNTER — Encounter: Payer: Self-pay | Admitting: Family Medicine

## 2016-04-24 ENCOUNTER — Ambulatory Visit (INDEPENDENT_AMBULATORY_CARE_PROVIDER_SITE_OTHER): Payer: BLUE CROSS/BLUE SHIELD | Admitting: Family Medicine

## 2016-04-24 VITALS — BP 128/68 | HR 70 | Temp 98.9°F | Ht 71.0 in | Wt 247.5 lb

## 2016-04-24 DIAGNOSIS — Z9989 Dependence on other enabling machines and devices: Secondary | ICD-10-CM

## 2016-04-24 DIAGNOSIS — R7303 Prediabetes: Secondary | ICD-10-CM | POA: Diagnosis not present

## 2016-04-24 DIAGNOSIS — G4733 Obstructive sleep apnea (adult) (pediatric): Secondary | ICD-10-CM | POA: Diagnosis not present

## 2016-04-24 DIAGNOSIS — Z23 Encounter for immunization: Secondary | ICD-10-CM

## 2016-04-24 DIAGNOSIS — N400 Enlarged prostate without lower urinary tract symptoms: Secondary | ICD-10-CM

## 2016-04-24 DIAGNOSIS — R030 Elevated blood-pressure reading, without diagnosis of hypertension: Secondary | ICD-10-CM

## 2016-04-24 DIAGNOSIS — E669 Obesity, unspecified: Secondary | ICD-10-CM

## 2016-04-24 DIAGNOSIS — E785 Hyperlipidemia, unspecified: Secondary | ICD-10-CM | POA: Diagnosis not present

## 2016-04-24 DIAGNOSIS — Z0000 Encounter for general adult medical examination without abnormal findings: Secondary | ICD-10-CM

## 2016-04-24 NOTE — Assessment & Plan Note (Signed)
Discussed healthy diet changes to improve levels. Encouraged continued efforts at weight loss.

## 2016-04-24 NOTE — Assessment & Plan Note (Signed)
This resolved with CPAP use.

## 2016-04-24 NOTE — Assessment & Plan Note (Signed)
Discussed healthy diet and lifestyle changes to affect sustainable weight loss  

## 2016-04-24 NOTE — Progress Notes (Signed)
Pre visit review using our clinic review tool, if applicable. No additional management support is needed unless otherwise documented below in the visit note. 

## 2016-04-24 NOTE — Assessment & Plan Note (Addendum)
Preventative protocols reviewed and updated unless pt declined. Discussed healthy diet and lifestyle.  School form filled out.  

## 2016-04-24 NOTE — Progress Notes (Signed)
BP 128/68   Pulse 70   Temp 98.9 F (37.2 C) (Oral)   Ht 5\' 11"  (1.803 m)   Wt 247 lb 8 oz (112.3 kg)   SpO2 97%   BMI 34.52 kg/m    CC: CPE Subjective:    Patient ID: Eric Fitzgerald, male    DOB: 02/17/1961, 55 y.o.   MRN: [id number]  HPI: Eric Fitzgerald is a 55 y.o. male presenting on 04/24/2016 for Annual Exam   [place]. [place]. Had knee arthroscopy as well [place]. Found to have severe OSA - started on CPAP, currently at 14. This has improved blood pressure and GERD. Continues working towards weight loss. Planning on joining [employer].   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   Preventative: Colonoscopy - 03/2010 normal, rpt 10 years. No family hx colon cancer  Prostate - nocturia x2-3at night. Strong stream. No fmhx prostate cancer, no h/o BPH. Discussed prostate screening. Desires yearly prostate screening.  Flu shot yearly Tdap 2014 Seat belt use discussed Sunscreen use discussed. No changing moles on skin. Non smoker. 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 Interim medical history since our last visit reviewed. Allergies and medications reviewed and updated. Current Outpatient Prescriptions on File Prior to Visit  Medication Sig  . aspirin EC 81 MG tablet Take 1 tablet (81 mg total) by mouth daily.  Marland Kitchen. atorvastatin (LIPITOR) 40 MG tablet Take 1 tablet (40 mg total) by mouth daily.   No current facility-administered medications on file prior to visit.     Review of Systems  Constitutional: Negative for activity change, appetite  change, chills, fatigue, fever and unexpected weight change.  HENT: Negative for hearing loss.   Eyes: Negative for visual disturbance.  Respiratory: Negative for cough, chest tightness, shortness of breath and wheezing.   Cardiovascular: Negative for chest pain, palpitations and leg swelling.  Gastrointestinal: Negative for abdominal distention, abdominal pain, blood in stool, constipation, diarrhea, nausea and vomiting.  Genitourinary: Negative for difficulty urinating and hematuria.  Musculoskeletal: Negative for arthralgias, myalgias and neck pain.  Skin: Negative for rash.  Neurological: Negative for dizziness, seizures, syncope and headaches.  Hematological: Negative for adenopathy. Does not bruise/bleed easily.  Psychiatric/Behavioral: Negative for dysphoric mood. The patient is not nervous/anxious.    Per HPI unless specifically indicated in ROS section     Objective:    BP 128/68   Pulse 70   Temp 98.9 F (37.2 C) (Oral)   Ht 5\' 11"  (1.803 m)   Wt 247 lb 8 oz (112.3 kg)   SpO2 97%   BMI 34.52 kg/m   Wt Readings from Last 3 Encounters:  04/24/16 247 lb 8 oz (112.3 kg)  02/01/15 249 lb 4 oz (113.1 kg)  09/28/14 248 lb 8 oz (112.7 kg)    Physical Exam  Constitutional: He is oriented to person, place, and time. He appears well-developed and well-nourished. No distress.  HENT:  Head: Normocephalic and atraumatic.  Right Ear: Hearing, tympanic membrane, external ear and ear canal normal.  Left Ear: Hearing, tympanic membrane, external ear and ear canal normal.  Nose:  Nose normal.  Mouth/Throat: Uvula is midline, oropharynx is clear and moist and mucous membranes are normal. No oropharyngeal exudate, posterior oropharyngeal edema or posterior oropharyngeal erythema.  Eyes: Conjunctivae and EOM are normal. Pupils are equal, round, and reactive to light. No scleral icterus.  Neck: Normal range of motion. Neck supple. No thyromegaly present.  Cardiovascular: Normal rate,  regular rhythm, normal heart sounds and intact distal pulses.   No murmur heard. Pulses:      Radial pulses are 2+ on the right side, and 2+ on the left side.  Pulmonary/Chest: Effort normal and breath sounds normal. No respiratory distress. He has no wheezes. He has no rales.  Abdominal: Soft. Bowel sounds are normal. He exhibits no distension and no mass. There is no tenderness. There is no rebound and no guarding.  Genitourinary: Rectum normal and prostate normal. Rectal exam shows no external hemorrhoid, no internal hemorrhoid, no fissure, no mass, no tenderness and anal tone normal. Prostate is not enlarged (15gm) and not tender.  Musculoskeletal: Normal range of motion. He exhibits no edema.  Lymphadenopathy:    He has no cervical adenopathy.  Neurological: He is alert and oriented to person, place, and time.  CN grossly intact, station and gait intact  Skin: Skin is warm and dry. No rash noted.  Psychiatric: He has a normal mood and affect. His behavior is normal. Judgment and thought content normal.  Nursing note and vitals reviewed.  Results for orders placed or performed in visit on 04/21/16  Lipid panel  Result Value Ref Range   Cholesterol 127 0 - 200 mg/dL   Triglycerides 16.1 0.0 - 149.0 mg/dL   HDL 09.60 >45.40 mg/dL   VLDL 98.1 0.0 - 19.1 mg/dL   LDL Cholesterol 67 0 - 99 mg/dL   Total CHOL/HDL Ratio 3    NonHDL 87.21   Hemoglobin A1c  Result Value Ref Range   Hgb A1c MFr Bld 6.3 4.6 - 6.5 %  PSA  Result Value Ref Range   PSA 0.90 0.10 - 4.00 ng/mL  Basic metabolic panel  Result Value Ref Range   Sodium 139 135 - 145 mEq/L   Potassium 4.8 3.5 - 5.1 mEq/L   Chloride 103 96 - 112 mEq/L   CO2 30 19 - 32 mEq/L   Glucose, Bld 104 (H) 70 - 99 mg/dL   BUN 16 6 - 23 mg/dL   Creatinine, Ser 4.78 0.40 - 1.50 mg/dL   Calcium 9.1 8.4 - 29.5 mg/dL   GFR 62.13 >08.65 mL/min      Assessment & Plan:   Problem List Items Addressed This Visit    BPH (benign prostatic  hypertrophy)    Not significant on exam      RESOLVED: Elevated blood pressure reading without diagnosis of hypertension    This resolved with CPAP use.       Healthcare maintenance - Primary    Preventative protocols reviewed and updated unless pt declined. Discussed healthy diet and lifestyle.  School form filled out.      HLD (hyperlipidemia)    Chronic, wonderful control on current regimen. Continue.       Obesity, Class I, BMI 30-34.9    Discussed healthy diet and lifestyle changes to affect sustainable weight loss.      OSA on CPAP    [place] Continue CPAP - pt has found very effective.       Prediabetes    Discussed healthy diet changes  to improve levels. Encouraged continued efforts at weight loss.        Other Visit Diagnoses   None.      Follow up plan: Return in about 1 year (around 04/24/2017), or as needed, for annual exam, prior fasting for blood work.  Eustaquio Boyden, MD

## 2016-04-24 NOTE — Addendum Note (Signed)
Addended by: Annamarie MajorFUQUAY, Jaidan Prevette S on: 04/24/2016 06:32 PM   Modules accepted: Orders

## 2016-04-24 NOTE — Assessment & Plan Note (Signed)
Not significant on exam

## 2016-04-24 NOTE — Assessment & Plan Note (Signed)
[place]. Continue CPAP - pt has found very effective.

## 2016-04-24 NOTE — Assessment & Plan Note (Addendum)
Chronic, wonderful control on current regimen. Continue  

## 2016-04-24 NOTE — Patient Instructions (Addendum)
Flu shot today. Hearing and vision screens today. Sign release up front for sleep study done last year at [facility] in [place] Watch added sugars in diet to prevent progression of prediabetes.  Return as needed or in 1 year for next physical.  Health Maintenance, Male A healthy lifestyle and preventative care can promote health and wellness.  Maintain regular health, dental, and eye exams.  Eat a healthy diet. Foods like vegetables, fruits, whole grains, low-fat dairy products, and lean protein foods contain the nutrients you need and are low in calories. Decrease your intake of foods high in solid fats, added sugars, and salt. Get information about a proper diet from your health care provider, if necessary.  Regular physical exercise is one of the most important things you can do for your health. Most adults should get at least 150 minutes of moderate-intensity exercise (any activity that increases your heart rate and causes you to sweat) each week. In addition, most adults need muscle-strengthening exercises on 2 or more days a week.   Maintain a healthy weight. The body mass index (BMI) is a screening tool to identify possible weight problems. It provides an estimate of body fat based on height and weight. Your health care provider can find your BMI and can help you achieve or maintain a healthy weight. For males 20 years and older:  A BMI below 18.5 is considered underweight.  A BMI of 18.5 to 24.9 is normal.  A BMI of 25 to 29.9 is considered overweight.  A BMI of 30 and above is considered obese.  Maintain normal blood lipids and cholesterol by exercising and minimizing your intake of saturated fat. Eat a balanced diet with plenty of fruits and vegetables. Blood tests for lipids and cholesterol should begin at age 57 and be repeated every 5 years. If your lipid or cholesterol levels are high, you are over age 57, or you are at high risk for heart disease,  you may need your cholesterol levels checked more frequently.Ongoing high lipid and cholesterol levels should be treated with medicines if diet and exercise are not working.  If you smoke, find out from your health care provider how to quit. If you do not use tobacco, do not start.  Lung cancer screening is recommended for adults aged 55-80 years who are at high risk for developing lung cancer because of a history of smoking. A yearly low-dose CT scan of the lungs is recommended for people who have at least a 30-pack-year history of smoking and are current smokers or have quit within the past 15 years. A pack year of smoking is smoking an average of 1 pack of cigarettes a day for 1 year (for example, a 30-pack-year history of smoking could mean smoking 1 pack a day for 30 years or 2 packs a day for 15 years). Yearly screening should continue until the smoker has stopped smoking for at least 15 years. Yearly screening should be stopped for people who develop a health problem that would prevent them from having lung cancer treatment.  If you choose to drink alcohol, do not have more than 2 drinks per day. One drink is considered to be 12 oz (360 mL) of beer, 5 oz (150 mL) of wine, or 1.5 oz (45 mL) of liquor.  Avoid the use of street drugs. Do not share needles with anyone. Ask for help if you need support or instructions about stopping the use of drugs.  High blood pressure  causes heart disease and increases the risk of stroke. High blood pressure is more likely to develop in:  People who have blood pressure in the end of the normal range (100-139/85-89 mm Hg).  People who are overweight or obese.  People who are African American.  If you are 76-84 years of age, have your blood pressure checked every 3-5 years. If you are 69 years of age or older, have your blood pressure checked every year. You should have your blood pressure measured twice--once when you are at a hospital or clinic, and once when  you are not at a hospital or clinic. Record the average of the two measurements. To check your blood pressure when you are not at a hospital or clinic, you can use:  An automated blood pressure machine at a pharmacy.  A home blood pressure monitor.  If you are 79-65 years old, ask your health care provider if you should take aspirin to prevent heart disease.  Diabetes screening involves taking a blood sample to check your fasting blood sugar level. This should be done once every 3 years after age 43 if you are at a normal weight and without risk factors for diabetes. Testing should be considered at a younger age or be carried out more frequently if you are overweight and have at least 1 risk factor for diabetes.  Colorectal cancer can be detected and often prevented. Most routine colorectal cancer screening begins at the age of 65 and continues through age 34. However, your health care provider may recommend screening at an earlier age if you have risk factors for colon cancer. On a yearly basis, your health care provider may provide home test kits to check for hidden blood in the stool. A small camera at the end of a tube may be used to directly examine the colon (sigmoidoscopy or colonoscopy) to detect the earliest forms of colorectal cancer. Talk to your health care provider about this at age 57 when routine screening begins. A direct exam of the colon should be repeated every 5-10 years through age 69, unless early forms of precancerous polyps or small growths are found.  People who are at an increased risk for hepatitis B should be screened for this virus. You are considered at high risk for hepatitis B if:  You were born in a country where hepatitis B occurs often. Talk with your health care provider about which countries are considered high risk.  Your parents were born in a high-risk country and you have not received a shot to protect against hepatitis B (hepatitis B vaccine).  You have HIV  or AIDS.  You use needles to inject street drugs.  You live with, or have sex with, someone who has hepatitis B.  You are a man who has sex with other men (MSM).  You get hemodialysis treatment.  You take certain medicines for conditions like cancer, organ transplantation, and autoimmune conditions.  Hepatitis C blood testing is recommended for all people born from [birth year] through 1967 and any individual with known risk factors for hepatitis C.  Healthy men should no longer receive prostate-specific antigen (PSA) blood tests as part of routine cancer screening. Talk to your health care provider about prostate cancer screening.  Testicular cancer screening is not recommended for adolescents or adult males who have no symptoms. Screening includes self-exam, a health care provider exam, and other screening tests. Consult with your health care provider about any symptoms you have or any concerns you have  about testicular cancer.  Practice safe sex. Use condoms and avoid high-risk sexual practices to reduce the spread of sexually transmitted infections (STIs).  You should be screened for STIs, including gonorrhea and chlamydia if:  You are sexually active and are younger than 24 years.  You are older than 24 years, and your health care provider tells you that you are at risk for this type of infection.  Your sexual activity has changed since you were last screened, and you are at an increased risk for chlamydia or gonorrhea. Ask your health care provider if you are at risk.  If you are at risk of being infected with HIV, it is recommended that you take a prescription medicine daily to prevent HIV infection. This is called pre-exposure prophylaxis (PrEP). You are considered at risk if:  You are a man who has sex with other men (MSM).  You are a heterosexual man who is sexually active with multiple partners.  You take drugs by injection.  You are sexually active with a partner who has  HIV.  Talk with your health care provider about whether you are at high risk of being infected with HIV. If you choose to begin PrEP, you should first be tested for HIV. You should then be tested every 3 months for as long as you are taking PrEP.  Use sunscreen. Apply sunscreen liberally and repeatedly throughout the day. You should seek shade when your shadow is shorter than you. Protect yourself by wearing long sleeves, pants, a wide-brimmed hat, and sunglasses year round whenever you are outdoors.  Tell your health care provider of new moles or changes in moles, especially if there is a change in shape or color. Also, tell your health care provider if a mole is larger than the size of a pencil eraser.  A one-time screening for abdominal aortic aneurysm (AAA) and surgical repair of large AAAs by ultrasound is recommended for men aged 65-75 years who are current or former smokers.  Stay current with your vaccines (immunizations).   This information is not intended to replace advice given to you by your health care provider. Make sure you discuss any questions you have with your health care provider.   Document Released: 01/17/2008 Document Revised: 08/11/2014 Document Reviewed: 12/16/2010 Elsevier Interactive Patient Education [employer]

## 2016-04-29 ENCOUNTER — Encounter: Payer: Self-pay | Admitting: *Deleted

## 2016-05-05 ENCOUNTER — Ambulatory Visit: Payer: BLUE CROSS/BLUE SHIELD

## 2016-05-05 DIAGNOSIS — Z111 Encounter for screening for respiratory tuberculosis: Secondary | ICD-10-CM

## 2016-05-07 LAB — TB SKIN TEST: TB SKIN TEST: NEGATIVE

## 2016-05-12 ENCOUNTER — Encounter: Payer: Self-pay | Admitting: Family Medicine

## 2016-11-06 ENCOUNTER — Other Ambulatory Visit: Payer: Self-pay | Admitting: Family Medicine

## 2017-06-17 ENCOUNTER — Other Ambulatory Visit: Payer: Self-pay | Admitting: Family Medicine

## 2017-07-22 ENCOUNTER — Other Ambulatory Visit: Payer: Self-pay | Admitting: Family Medicine

## 2017-08-06 ENCOUNTER — Other Ambulatory Visit: Payer: Self-pay

## 2017-08-07 ENCOUNTER — Other Ambulatory Visit (INDEPENDENT_AMBULATORY_CARE_PROVIDER_SITE_OTHER): Payer: BLUE CROSS/BLUE SHIELD

## 2017-08-07 ENCOUNTER — Other Ambulatory Visit: Payer: Self-pay | Admitting: Family Medicine

## 2017-08-07 DIAGNOSIS — E785 Hyperlipidemia, unspecified: Secondary | ICD-10-CM

## 2017-08-07 DIAGNOSIS — Z125 Encounter for screening for malignant neoplasm of prostate: Secondary | ICD-10-CM

## 2017-08-07 DIAGNOSIS — Z1159 Encounter for screening for other viral diseases: Secondary | ICD-10-CM | POA: Diagnosis not present

## 2017-08-07 DIAGNOSIS — R7303 Prediabetes: Secondary | ICD-10-CM

## 2017-08-07 DIAGNOSIS — N400 Enlarged prostate without lower urinary tract symptoms: Secondary | ICD-10-CM

## 2017-08-07 LAB — LIPID PANEL
CHOL/HDL RATIO: 3
Cholesterol: 141 mg/dL (ref 0–200)
HDL: 42.6 mg/dL (ref >39.00)
LDL CALC: 78 mg/dL (ref 0–99)
NonHDL: 97.97
TRIGLYCERIDES: 99 mg/dL (ref 0.0–149.0)
VLDL: 19.8 mg/dL (ref 0.0–40.0)

## 2017-08-07 LAB — BASIC METABOLIC PANEL
BUN: 16 mg/dL (ref 6–23)
CHLORIDE: 103 meq/L (ref 96–112)
CO2: 29 mEq/L (ref 19–32)
CREATININE: 1.07 mg/dL (ref 0.40–1.50)
Calcium: 9.2 mg/dL (ref 8.4–10.5)
GFR: 75.33 mL/min (ref >60.00)
GLUCOSE: 112 mg/dL — AB (ref 70–99)
POTASSIUM: 4.7 meq/L (ref 3.5–5.1)
Sodium: 139 mEq/L (ref 135–145)

## 2017-08-07 LAB — PSA: PSA: 0.76 ng/mL (ref 0.10–4.00)

## 2017-08-07 LAB — HEMOGLOBIN A1C: Hgb A1c MFr Bld: 6.6 % — ABNORMAL HIGH (ref 4.6–6.5)

## 2017-08-08 ENCOUNTER — Other Ambulatory Visit: Payer: Self-pay | Admitting: Family Medicine

## 2017-08-08 LAB — HEPATITIS C ANTIBODY
HEP C AB: NONREACTIVE
SIGNAL TO CUT-OFF: 0.02 (ref <1.00)

## 2017-08-10 MED ORDER — ATORVASTATIN CALCIUM 40 MG PO TABS: 40.0000 mg | ORAL_TABLET | Freq: Every day | ORAL | 0 refills | Status: DC

## 2017-08-12 ENCOUNTER — Encounter: Payer: Self-pay | Admitting: Family Medicine

## 2017-08-12 ENCOUNTER — Ambulatory Visit (INDEPENDENT_AMBULATORY_CARE_PROVIDER_SITE_OTHER): Payer: BLUE CROSS/BLUE SHIELD | Admitting: Family Medicine

## 2017-08-12 VITALS — BP 124/62 | HR 73 | Temp 98.6°F | Ht 70.5 in | Wt 265.0 lb

## 2017-08-12 DIAGNOSIS — N400 Enlarged prostate without lower urinary tract symptoms: Secondary | ICD-10-CM | POA: Diagnosis not present

## 2017-08-12 DIAGNOSIS — Z0000 Encounter for general adult medical examination without abnormal findings: Secondary | ICD-10-CM | POA: Diagnosis not present

## 2017-08-12 DIAGNOSIS — Z9989 Dependence on other enabling machines and devices: Secondary | ICD-10-CM | POA: Diagnosis not present

## 2017-08-12 DIAGNOSIS — E119 Type 2 diabetes mellitus without complications: Secondary | ICD-10-CM | POA: Diagnosis not present

## 2017-08-12 DIAGNOSIS — E785 Hyperlipidemia, unspecified: Secondary | ICD-10-CM

## 2017-08-12 DIAGNOSIS — G4733 Obstructive sleep apnea (adult) (pediatric): Secondary | ICD-10-CM | POA: Diagnosis not present

## 2017-08-12 MED ORDER — ASPIRIN EC 81 MG PO TBEC: 81.0000 mg | DELAYED_RELEASE_TABLET | ORAL | Status: DC

## 2017-08-12 MED ORDER — ATORVASTATIN CALCIUM 40 MG PO TABS: 40.0000 mg | ORAL_TABLET | Freq: Every day | ORAL | 3 refills | Status: DC

## 2017-08-12 NOTE — Assessment & Plan Note (Signed)
Chronic, stable. Continue lipitor. The 10-year ASCVD risk score Denman George(Goff DC Montez HagemanJr., et al., 2013) is: 5.5%   Values used to calculate the score:     Age: [age] years     Sex: Male     Is Non-Hispanic African American: No     Diabetic: No     Tobacco smoker: No     Systolic Blood Pressure: 124 mmHg     Is BP treated: No     HDL Cholesterol: 42.6 mg/dL     Total Cholesterol: 141 mg/dL

## 2017-08-12 NOTE — Assessment & Plan Note (Signed)
Preventative protocols reviewed and updated unless pt declined. Discussed healthy diet and lifestyle.  

## 2017-08-12 NOTE — Assessment & Plan Note (Signed)
[place]. He also was found to have significant PLM.

## 2017-08-12 NOTE — Assessment & Plan Note (Signed)
DRE/PSA reassuring.  

## 2017-08-12 NOTE — Progress Notes (Signed)
BP 124/62 (BP Location: Left Arm, Patient Position: Sitting, Cuff Size: Large)   Pulse 73   Temp 98.6 F (37 C) (Oral)   Ht 5' 10.5" (1.791 m)   Wt 265 lb (120.2 kg)   SpO2 98%   BMI 37.49 kg/m    CC: CPE Subjective:    Patient ID: Eric Fitzgerald Doescher, male    DOB: 03/02/1961, 57 y.o.   MRN: [id number]  HPI: Eric Fitzgerald Mellin is a 57 y.o. 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  PRN aspirin - if taken too regularly nose starts bleeding.  OSA on CPAP [place] records in [place]. He also was found to have significant PLM. Feels well on CPAP.   Preventative: Colonoscopy - 03/2010 normal, rpt 10 years. No family hx colon cancer  Prostate - nocturia x2-3at night. Strong stream. No fmhx prostate cancer, no h/o BPH. Desires yearly prostate screening.  Flu shot yearly Tdap 2014, 2016  Seat belt use discussed  Sunscreen use discussed. No changing moles on skin. Non smoker. 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 Interim medical history since our last visit reviewed. Allergies and medications reviewed and updated. Outpatient Medications Prior to Visit  Medication Sig Dispense Refill  . aspirin EC 81 MG tablet Take 1 tablet (81 mg total) by mouth daily.    Marland Kitchen. atorvastatin (LIPITOR) 40 MG tablet Take 1 tablet (40 mg total) by mouth daily. 30 tablet 0  . atorvastatin (LIPITOR) 40 MG tablet Take 1 tablet (40 mg total) by mouth daily. 30 tablet 11  . atorvastatin (LIPITOR) 40 MG tablet TAKE 1 TABLET (40 MG TOTAL) BY MOUTH DAILY. 30 tablet 6   No facility-administered medications prior to visit.       Per HPI unless specifically indicated in ROS section below Review of Systems  Constitutional: Negative for activity change, appetite change, chills, fatigue, fever and unexpected weight change.  HENT: Negative for hearing loss.   Eyes: Negative for visual disturbance.  Respiratory: Negative for cough, chest tightness, shortness of breath and wheezing.   Cardiovascular: Negative for chest pain, palpitations and leg swelling.  Gastrointestinal: Negative for abdominal distention, abdominal pain, blood in stool, constipation, diarrhea, nausea and vomiting.  Genitourinary: Negative for difficulty urinating and hematuria.  Musculoskeletal: Negative for arthralgias, myalgias and neck pain.  Skin: Negative for rash.  Neurological: Negative for dizziness, seizures, syncope and headaches.  Hematological: Negative for adenopathy. Does not bruise/bleed easily.  Psychiatric/Behavioral: Negative for dysphoric mood. The patient is not nervous/anxious.        Objective:    BP 124/62 (BP Location: Left Arm, Patient Position: Sitting, Cuff Size: Large)   Pulse 73   Temp 98.6 F (37 C) (Oral)   Ht 5' 10.5" (1.791 m)   Wt 265 lb (120.2 kg)   SpO2 98%   BMI 37.49 kg/m   Wt Readings from Last 3 Encounters:  08/12/17 265 lb (120.2 kg)  04/24/16 247 lb 8 oz (112.3 kg)  02/01/15 249 lb 4 oz (113.1 kg)    Physical Exam  Constitutional: He is oriented to person, place, and time. He appears well-developed  and well-nourished. No distress.  HENT:  Head: Normocephalic and atraumatic.  Right Ear: Hearing, tympanic membrane, external ear and ear canal normal.  Left Ear: Hearing, tympanic membrane, external ear and ear canal normal.  Nose: Nose normal.  Mouth/Throat: Uvula is midline, oropharynx is clear and moist and mucous membranes are normal. No oropharyngeal exudate, posterior oropharyngeal edema or posterior oropharyngeal erythema.  Eyes: Conjunctivae and EOM are normal. Pupils are equal, round, and  reactive to light. No scleral icterus.  Neck: Normal range of motion. Neck supple. No thyromegaly present.  Cardiovascular: Normal rate, regular rhythm, normal heart sounds and intact distal pulses.  No murmur heard. Pulses:      Radial pulses are 2+ on the right side, and 2+ on the left side.  Pulmonary/Chest: Effort normal and breath sounds normal. No respiratory distress. He has no wheezes. He has no rales.  Abdominal: Soft. Bowel sounds are normal. He exhibits no distension and no mass. There is no tenderness. There is no rebound and no guarding.  Genitourinary: Rectum normal and prostate normal. Rectal exam shows no external hemorrhoid, no fissure, no mass, no tenderness and anal tone normal. Prostate is not enlarged (15gm) and not tender.  Musculoskeletal: Normal range of motion. He exhibits no edema.  Lymphadenopathy:    He has no cervical adenopathy.  Neurological: He is alert and oriented to person, place, and time.  CN grossly intact, station and gait intact  Skin: Skin is warm and dry. No rash noted.  Irritated SK RUE  Psychiatric: He has a normal mood and affect. His behavior is normal. Judgment and thought content normal.  Nursing note and vitals reviewed.  Results for orders placed or performed in visit on 08/07/17  Hepatitis C antibody  Result Value Ref Range   Hepatitis C Ab NON-REACTIVE NON-REACTI   SIGNAL TO CUT-OFF 0.02 <1.00  Basic metabolic panel  Result Value Ref Range   Sodium 139 135 - 145 mEq/Fitzgerald   Potassium 4.7 3.5 - 5.1 mEq/Fitzgerald   Chloride 103 96 - 112 mEq/Fitzgerald   CO2 29 19 - 32 mEq/Fitzgerald   Glucose, Bld 112 (H) 70 - 99 mg/dL   BUN 16 6 - 23 mg/dL   Creatinine, Ser 1.61 0.40 - 1.50 mg/dL   Calcium 9.2 8.4 - 09.6 mg/dL   GFR 04.54 >09.81 mL/min  Hemoglobin A1c  Result Value Ref Range   Hgb A1c MFr Bld 6.6 (H) 4.6 - 6.5 %  PSA  Result Value Ref Range   PSA 0.76 0.10 - 4.00 ng/mL  Lipid panel  Result Value Ref Range   Cholesterol 141 0 - 200 mg/dL   Triglycerides  19.1 0.0 - 149.0 mg/dL   HDL 47.82 >95.62 mg/dL   VLDL 13.0 0.0 - 86.5 mg/dL   LDL Cholesterol 78 0 - 99 mg/dL   Total CHOL/HDL Ratio 3    NonHDL 97.97       Assessment & Plan:   Problem List Items Addressed This Visit    Benign prostatic hyperplasia    DRE/PSA reassuring.       Diabetes mellitus type 2, controlled, without complications (HCC)    New diagnosis.  Reviewed healthy diet, lifestyle changes, and planned weight loss to help control sugar levels.  RTC 6 mo DM f/u visit.  Confident we can manage diabetes with weight loss.       Healthcare maintenance - Primary    Preventative protocols reviewed and updated unless pt declined. Discussed healthy diet and lifestyle.  HLD (hyperlipidemia)    Chronic, stable. Continue lipitor. The 10-year ASCVD risk score Denman George DC Montez Hageman., et al., 2013) is: 5.5%   Values used to calculate the score:     Age: 24 years     Sex: Male     Is Non-Hispanic African American: No     Diabetic: No     Tobacco smoker: No     Systolic Blood Pressure: 124 mmHg     Is BP treated: No     HDL Cholesterol: 42.6 mg/dL     Total Cholesterol: 141 mg/dL       OSA on CPAP    [place] records in [place]. He also was found to have significant PLM.       Severe obesity (BMI 35.0-39.9) with comorbidity (HCC)    Weight gain reviewed. Discussed healthy diet and lifestyle changes to affect sustainable weight loss. Pt motivated to increase activity at gym - going after CPE today - and will work on diabetic diet.          Follow up plan: Return in about 6 months (around 02/09/2018) for follow up visit.  Eustaquio Boyden, MD

## 2017-08-12 NOTE — Patient Instructions (Signed)
Sugar returned in diabetes range with A1c 6.6% - work on low sugar low carb diet, watch portion sizes, decrease sweetened beverages, work on walking routine. Return in 6 months for diabetes recheck.  Diabetes Mellitus and Nutrition When you have diabetes (diabetes mellitus), it is very important to have healthy eating habits because your blood sugar (glucose) levels are greatly affected by what you eat and drink. Eating healthy foods in the appropriate amounts, at about the same times every day, can help you:  Control your blood glucose.  Lower your risk of heart disease.  Improve your blood pressure.  Reach or maintain a healthy weight.  Every person with diabetes is different, and each person has different needs for a meal plan. Your health care provider may recommend that you work with a diet and nutrition specialist (dietitian) to make a meal plan that is best for you. Your meal plan may vary depending on factors such as:  The calories you need.  The medicines you take.  Your weight.  Your blood glucose, blood pressure, and cholesterol levels.  Your activity level.  Other health conditions you have, such as heart or kidney disease.  How do carbohydrates affect me? Carbohydrates affect your blood glucose level more than any other type of food. Eating carbohydrates naturally increases the amount of glucose in your blood. Carbohydrate counting is a method for keeping track of how many carbohydrates you eat. Counting carbohydrates is important to keep your blood glucose at a healthy level, especially if you use insulin or take certain oral diabetes medicines. It is important to know how many carbohydrates you can safely have in each meal. This is different for every person. Your dietitian can help you calculate how many carbohydrates you should have at each meal and for snack. Foods that contain carbohydrates include:  Bread, cereal, rice, pasta, and crackers.  Potatoes and  corn.  Peas, beans, and lentils.  Milk and yogurt.  Fruit and juice.  Desserts, such as cakes, cookies, ice cream, and candy.  How does alcohol affect me? Alcohol can cause a sudden decrease in blood glucose (hypoglycemia), especially if you use insulin or take certain oral diabetes medicines. Hypoglycemia can be a life-threatening condition. Symptoms of hypoglycemia (sleepiness, dizziness, and confusion) are similar to symptoms of having too much alcohol. If your health care provider says that alcohol is safe for you, follow these guidelines:  Limit alcohol intake to no more than 1 drink per day for nonpregnant women and 2 drinks per day for men. One drink equals 12 oz of beer, 5 oz of wine, or 1 oz of hard liquor.  Do not drink on an empty stomach.  Keep yourself hydrated with water, diet soda, or unsweetened iced tea.  Keep in mind that regular soda, juice, and other mixers may contain a lot of sugar and must be counted as carbohydrates.  What are tips for following this plan? Reading food labels  Start by checking the serving size on the label. The amount of calories, carbohydrates, fats, and other nutrients listed on the label are based on one serving of the food. Many foods contain more than one serving per package.  Check the total grams (g) of carbohydrates in one serving. You can calculate the number of servings of carbohydrates in one serving by dividing the total carbohydrates by 15. For example, if a food has 30 g of total carbohydrates, it would be equal to 2 servings of carbohydrates.  Check the number of grams (  g) of saturated and trans fats in one serving. Choose foods that have low or no amount of these fats.  Check the number of milligrams (mg) of sodium in one serving. Most people should limit total sodium intake to less than 2,300 mg per day.  Always check the nutrition information of foods labeled as "low-fat" or "nonfat". These foods may be higher in added  sugar or refined carbohydrates and should be avoided.  Talk to your dietitian to identify your daily goals for nutrients listed on the label. Shopping  Avoid buying canned, premade, or processed foods. These foods tend to be high in fat, sodium, and added sugar.  Shop around the outside edge of the grocery store. This includes fresh fruits and vegetables, bulk grains, fresh meats, and fresh dairy. Cooking  Use low-heat cooking methods, such as baking, instead of high-heat cooking methods like deep frying.  Cook using healthy oils, such as olive, canola, or sunflower oil.  Avoid cooking with butter, cream, or high-fat meats. Meal planning  Eat meals and snacks regularly, preferably at the same times every day. Avoid going long periods of time without eating.  Eat foods high in fiber, such as fresh fruits, vegetables, beans, and whole grains. Talk to your dietitian about how many servings of carbohydrates you can eat at each meal.  Eat 4-6 ounces of lean protein each day, such as lean meat, chicken, fish, eggs, or tofu. 1 ounce is equal to 1 ounce of meat, chicken, or fish, 1 egg, or 1/4 cup of tofu.  Eat some foods each day that contain healthy fats, such as avocado, nuts, seeds, and fish. Lifestyle   Check your blood glucose regularly.  Exercise at least 30 minutes 5 or more days each week, or as told by your health care provider.  Take medicines as told by your health care provider.  Do not use any products that contain nicotine or tobacco, such as cigarettes and e-cigarettes. If you need help quitting, ask your health care provider.  Work with a Veterinary surgeoncounselor or diabetes educator to identify strategies to manage stress and any emotional and social challenges. What are some questions to ask my health care provider?  Do I need to meet with a diabetes educator?  Do I need to meet with a dietitian?  What number can I call if I have questions?  When are the best times to check my  blood glucose? Where to find more information:  American Diabetes Association: diabetes.org/food-and-fitness/food  Academy of Nutrition and Dietetics: https://www.vargas.com/www.eatright.org/resources/health/diseases-and-conditions/diabetes  [employer] of Diabetes and Digestive and Kidney Diseases (NIH): FindJewelers.czwww.niddk.nih.gov/health-information/diabetes/overview/diet-eating-physical-activity Summary  A healthy meal plan will help you control your blood glucose and maintain a healthy lifestyle.  Working with a diet and nutrition specialist (dietitian) can help you make a meal plan that is best for you.  Keep in mind that carbohydrates and alcohol have immediate effects on your blood glucose levels. It is important to count carbohydrates and to use alcohol carefully. This information is not intended to replace advice given to you by your health care provider. Make sure you discuss any questions you have with your health care provider. Document Released: 04/17/2005 Document Revised: 08/25/2016 Document Reviewed: 08/25/2016 Elsevier Interactive Patient Education  [employer]2018 Elsevier Inc.

## 2017-08-12 NOTE — Assessment & Plan Note (Signed)
New diagnosis.  Reviewed healthy diet, lifestyle changes, and planned weight loss to help control sugar levels.  RTC 6 mo DM f/u visit.  Confident we can manage diabetes with weight loss.

## 2017-08-12 NOTE — Assessment & Plan Note (Signed)
Weight gain reviewed. Discussed healthy diet and lifestyle changes to affect sustainable weight loss. Pt motivated to increase activity at gym - going after CPE today - and will work on diabetic diet.

## 2018-02-05 ENCOUNTER — Other Ambulatory Visit (INDEPENDENT_AMBULATORY_CARE_PROVIDER_SITE_OTHER): Payer: BLUE CROSS/BLUE SHIELD

## 2018-02-05 ENCOUNTER — Other Ambulatory Visit: Payer: Self-pay | Admitting: Family Medicine

## 2018-02-05 DIAGNOSIS — E119 Type 2 diabetes mellitus without complications: Secondary | ICD-10-CM

## 2018-02-05 DIAGNOSIS — E785 Hyperlipidemia, unspecified: Secondary | ICD-10-CM

## 2018-02-05 LAB — BASIC METABOLIC PANEL
BUN: 17 mg/dL (ref 6–23)
CHLORIDE: 104 meq/L (ref 96–112)
CO2: 27 meq/L (ref 19–32)
CREATININE: 0.95 mg/dL (ref 0.40–1.50)
Calcium: 8.9 mg/dL (ref 8.4–10.5)
GFR: 86.27 mL/min (ref >60.00)
GLUCOSE: 120 mg/dL — AB (ref 70–99)
POTASSIUM: 4.5 meq/L (ref 3.5–5.1)
Sodium: 138 mEq/L (ref 135–145)

## 2018-02-05 LAB — HEMOGLOBIN A1C: HEMOGLOBIN A1C: 6.5 % (ref 4.6–6.5)

## 2018-02-05 LAB — MICROALBUMIN / CREATININE URINE RATIO
Creatinine,U: 143.5 mg/dL
Microalb Creat Ratio: 0.5 mg/g (ref 0.0–30.0)
Microalb, Ur: <0.7 mg/dL (ref 0.0–1.9)

## 2018-02-09 ENCOUNTER — Encounter: Payer: Self-pay | Admitting: Family Medicine

## 2018-02-09 ENCOUNTER — Ambulatory Visit: Payer: BLUE CROSS/BLUE SHIELD | Admitting: Family Medicine

## 2018-02-09 VITALS — BP 122/78 | HR 63 | Temp 98.3°F | Ht 71.0 in | Wt 267.5 lb

## 2018-02-09 DIAGNOSIS — Z9989 Dependence on other enabling machines and devices: Secondary | ICD-10-CM

## 2018-02-09 DIAGNOSIS — G4733 Obstructive sleep apnea (adult) (pediatric): Secondary | ICD-10-CM

## 2018-02-09 DIAGNOSIS — E785 Hyperlipidemia, unspecified: Secondary | ICD-10-CM

## 2018-02-09 DIAGNOSIS — E119 Type 2 diabetes mellitus without complications: Secondary | ICD-10-CM | POA: Diagnosis not present

## 2018-02-09 MED ORDER — ATORVASTATIN CALCIUM 40 MG PO TABS: 40.0000 mg | ORAL_TABLET | Freq: Every day | ORAL | 3 refills | Status: DC

## 2018-02-09 MED ORDER — METFORMIN HCL 500 MG PO TABS: 500.0000 mg | ORAL_TABLET | Freq: Every day | ORAL | 1 refills | Status: DC

## 2018-02-09 NOTE — Assessment & Plan Note (Signed)
Tolerating well

## 2018-02-09 NOTE — Assessment & Plan Note (Signed)
Relatively new diagnosis last visit - no change in A1c - remains in diet controlled range - will start metformin 500mg  once daily with meals, discussed anticipated GI upset first few days. Discussed DSME - pt declines for now. Discussed goal weight loss - see above.

## 2018-02-09 NOTE — Assessment & Plan Note (Addendum)
Tolerating statin well continue  

## 2018-02-09 NOTE — Assessment & Plan Note (Addendum)
Struggles with portion sizes. Pt motivated for weight loss efforts - encouraged return to gym to establish exercise regimen.

## 2018-02-09 NOTE — Progress Notes (Signed)
BP 122/78 (BP Location: Left Arm, Patient Position: Sitting, Cuff Size: Large)   Pulse 63   Temp 98.3 F (36.8 C) (Oral)   Ht 5\' 11"  (1.803 m)   Wt 267 lb 8 oz (121.3 kg)   SpO2 98%   BMI 37.31 kg/m    CC: 6 mo DM f/u visit Subjective:    Patient ID: Eric Fitzgerald Stamey, male    DOB: 06/04/1961, 57 y.o.   MRN: [id number]  HPI: Eric Fitzgerald Ayllon is a 57 y.o. 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   Trouble walking due to chronic R knee pain - h/o ACL tear, meniscal repair (2017).   DM - does not regularly check sugars. Compliant with antihyperglycemic regimen which includes: diet controlled. Denies low sugars or hypoglycemic symptoms. Denies paresthesias. Last diabetic eye exam DUE. Pneumovax: DUE. Prevnar: not due. Glucometer brand: does not have one at home. DSME: has not completed. Large portions, snacks at night.  Lab Results  Component Value Date   HGBA1C 6.5 02/05/2018   Diabetic Foot Exam - Simple   Simple Foot Form Diabetic Foot exam was performed with the following findings:  Yes 02/09/2018  9:44 AM  Visual Inspection See comments:  Yes Sensation Testing Intact to touch and monofilament testing bilaterally:  Yes Pulse Check Posterior Tibialis and Dorsalis pulse intact bilaterally:  Yes Comments Scaling of soles bilaterally    Lab Results  Component Value Date   MICROALBUR <0.7 02/05/2018     HLD - compliant with lipitor without myalgias.  OSA - on CPAP and tolerating well.   Relevant past medical, surgical, family and social history reviewed and updated as indicated. Interim medical history since our last visit reviewed. Allergies and medications reviewed and updated. Outpatient Medications Prior to Visit  Medication Sig Dispense Refill  . aspirin EC 81 MG tablet Take 1 tablet (81 mg total) by mouth every Monday, Wednesday, and Friday.    Marland Kitchen. atorvastatin (LIPITOR) 40 MG tablet Take 1 tablet (40 mg  total) by mouth daily. 90 tablet 3   No facility-administered medications prior to visit.      Per HPI unless specifically indicated in ROS section below Review of Systems     Objective:    BP 122/78 (BP Location: Left Arm, Patient Position: Sitting, Cuff Size: Large)   Pulse 63   Temp 98.3 F (36.8 C) (Oral)   Ht 5\' 11"  (1.803 m)   Wt 267 lb 8 oz (121.3 kg)   SpO2 98%   BMI 37.31 kg/m   Wt Readings from Last 3 Encounters:  02/09/18 267 lb 8 oz (121.3 kg)  08/12/17 265 lb (120.2 kg)  04/24/16 247 lb 8 oz (112.3 kg)    Physical Exam  Constitutional: He appears well-developed and well-nourished. No distress.  HENT:  Head: Normocephalic and atraumatic.  Right Ear: External ear normal.  Left Ear: External ear normal.  Nose: Nose normal.  Mouth/Throat: Oropharynx is clear and moist. No oropharyngeal exudate.  Eyes: Pupils are equal, round, and reactive to light. Conjunctivae and EOM are normal. No scleral icterus.  Neck: Normal range of motion. Neck supple.  Cardiovascular: Normal rate, regular rhythm, normal heart sounds and intact distal pulses.  No murmur heard. Pulmonary/Chest: Effort normal and breath sounds normal. No respiratory distress. He has no wheezes. He has no rales.  Musculoskeletal: He exhibits no edema.  See HPI for foot exam if done  Lymphadenopathy:  He has no cervical adenopathy.  Skin: Skin is warm and dry. No rash noted.  Psychiatric: He has a normal mood and affect.  Nursing note and vitals reviewed.  Results for orders placed or performed in visit on 02/05/18  Microalbumin / creatinine urine ratio  Result Value Ref Range   Microalb, Ur <0.7 0.0 - 1.9 mg/dL   Creatinine,U 161.0 mg/dL   Microalb Creat Ratio 0.5 0.0 - 30.0 mg/g  Basic metabolic panel  Result Value Ref Range   Sodium 138 135 - 145 mEq/Fitzgerald   Potassium 4.5 3.5 - 5.1 mEq/Fitzgerald   Chloride 104 96 - 112 mEq/Fitzgerald   CO2 27 19 - 32 mEq/Fitzgerald   Glucose, Bld 120 (H) 70 - 99 mg/dL   BUN 17 6 - 23  mg/dL   Creatinine, Ser 9.60 0.40 - 1.50 mg/dL   Calcium 8.9 8.4 - 45.4 mg/dL   GFR 09.81 >19.14 mL/min  Hemoglobin A1c  Result Value Ref Range   Hgb A1c MFr Bld 6.5 4.6 - 6.5 %      Assessment & Plan:   Problem List Items Addressed This Visit    Severe obesity (BMI 35.0-39.9) with comorbidity (HCC)    Struggles with portion sizes. Pt motivated for weight loss efforts - encouraged return to gym to establish exercise regimen.       Relevant Medications   metFORMIN (GLUCOPHAGE) 500 MG tablet   OSA on CPAP    Tolerating well.       HLD (hyperlipidemia)    Tolerating statin well - continue      Relevant Medications   atorvastatin (LIPITOR) 40 MG tablet   Diabetes mellitus type 2, controlled, without complications (HCC) - Primary    Relatively new diagnosis last visit - no change in A1c - remains in diet controlled range - will start metformin 500mg  once daily with meals, discussed anticipated GI upset first few days. Discussed DSME - pt declines for now. Discussed goal weight loss - see above.      Relevant Medications   atorvastatin (LIPITOR) 40 MG tablet   metFORMIN (GLUCOPHAGE) 500 MG tablet       Meds ordered this encounter  Medications  . atorvastatin (LIPITOR) 40 MG tablet    Sig: Take 1 tablet (40 mg total) by mouth daily.    Dispense:  90 tablet    Refill:  3  . metFORMIN (GLUCOPHAGE) 500 MG tablet    Sig: Take 1 tablet (500 mg total) by mouth daily with breakfast.    Dispense:  90 tablet    Refill:  1   No orders of the defined types were placed in this encounter.   Follow up plan: Return in about 6 months (around 08/12/2018) for annual exam, prior fasting for blood work.  Eustaquio Boyden, MD

## 2018-02-09 NOTE — Patient Instructions (Addendum)
Let's start metformin 500mg  once daily with breakfast or dinner.  Try to get to the gym - work on regular exercise regimen.  Return as needed or in 6 months for physical.

## 2018-08-08 ENCOUNTER — Other Ambulatory Visit: Payer: Self-pay | Admitting: Family Medicine

## 2018-08-08 DIAGNOSIS — N400 Enlarged prostate without lower urinary tract symptoms: Secondary | ICD-10-CM

## 2018-08-08 DIAGNOSIS — E785 Hyperlipidemia, unspecified: Secondary | ICD-10-CM

## 2018-08-08 DIAGNOSIS — E119 Type 2 diabetes mellitus without complications: Secondary | ICD-10-CM

## 2018-08-10 ENCOUNTER — Other Ambulatory Visit: Payer: BLUE CROSS/BLUE SHIELD

## 2018-08-13 ENCOUNTER — Encounter: Payer: BLUE CROSS/BLUE SHIELD | Admitting: Family Medicine

## 2018-09-26 DIAGNOSIS — I10 Essential (primary) hypertension: Secondary | ICD-10-CM | POA: Insufficient documentation

## 2018-10-09 ENCOUNTER — Other Ambulatory Visit: Payer: Self-pay | Admitting: Family Medicine

## 2018-11-03 ENCOUNTER — Other Ambulatory Visit: Payer: Self-pay | Admitting: Family Medicine

## 2018-11-30 ENCOUNTER — Other Ambulatory Visit: Payer: Self-pay | Admitting: Family Medicine

## 2018-12-23 ENCOUNTER — Telehealth: Payer: Self-pay | Admitting: Family Medicine

## 2018-12-24 NOTE — Telephone Encounter (Signed)
Noted! Thank you

## 2018-12-24 NOTE — Telephone Encounter (Signed)
Pt state due to his insurance changing he had to switch to [facility]. I removed Dr. Reece Agar from his pcp. He said if anything changes with insurance he will call to come home. I let him know he would have to do a request an a reestablish care appt with Dr. Reece Agar and he was ok with that. Just wanted to let you all know.

## 2018-12-24 NOTE — Telephone Encounter (Signed)
Noted.  Fyi to Dr. G.  

## 2018-12-24 NOTE — Telephone Encounter (Signed)
E-scribed metformin refill.  Pls schedule virtual annual CPE and lab visit.

## 2019-01-21 ENCOUNTER — Other Ambulatory Visit: Payer: Self-pay | Admitting: Family Medicine

## 2019-02-19 ENCOUNTER — Other Ambulatory Visit: Payer: Self-pay | Admitting: Family Medicine

## 2019-03-08 ENCOUNTER — Other Ambulatory Visit: Payer: Self-pay | Admitting: Family Medicine

## 2019-07-16 ENCOUNTER — Other Ambulatory Visit: Payer: Self-pay | Admitting: Family Medicine

## 2019-08-14 ENCOUNTER — Other Ambulatory Visit: Payer: Self-pay | Admitting: Family Medicine

## 2019-10-14 ENCOUNTER — Other Ambulatory Visit: Payer: Self-pay | Admitting: Family Medicine

## 2019-10-14 NOTE — Telephone Encounter (Signed)
Pt no longer sees Dr. Glory Rosebush TE, 12/23/18]

## 2019-11-10 ENCOUNTER — Other Ambulatory Visit: Payer: Self-pay | Admitting: Family Medicine

## 2021-04-09 LAB — HM COLONOSCOPY

## 2022-01-15 ENCOUNTER — Ambulatory Visit
Admission: RE | Admit: 2022-01-15 | Discharge: 2022-01-15 | Disposition: A | Discharge status: Home / Self Care | Payer: BLUE CROSS/BLUE SHIELD | Source: Ambulatory Visit | Attending: Emergency Medicine | Admitting: Emergency Medicine

## 2022-01-15 VITALS — BP 188/79 | HR 74 | Temp 99.1°F | Resp 18

## 2022-01-15 DIAGNOSIS — W57XXXA Bitten or stung by nonvenomous insect and other nonvenomous arthropods, initial encounter: Secondary | ICD-10-CM

## 2022-01-15 DIAGNOSIS — S70361A Insect bite (nonvenomous), right thigh, initial encounter: Secondary | ICD-10-CM

## 2022-01-15 MED ORDER — DOXYCYCLINE HYCLATE 100 MG PO CAPS: 100.0000 mg | ORAL_CAPSULE | Freq: Two times a day (BID) | ORAL | 0 refills | Status: AC

## 2022-01-15 NOTE — Discharge Instructions (Addendum)
Recheck your blood pressure in 2 days. Take doxycycline twice daily for the next 7 days. If your symptoms worsen at home, please go to the emergency department at [facility].

## 2022-01-15 NOTE — ED Provider Notes (Signed)
Patient Contact: 3:51 PM (approximate)   History   Insect Bite (Entered by patient)   HPI  Eric Fitzgerald is a 61 y.o. male presents to the emergency department after patient discovered a tick along his right groin.  Patient is unsure how long tick was in place but states that he has had some body aches and low-grade fever at home.  He is uncertain as to whether or not he has a rash in the affected area.  He denies chest pain, chest tightness or abdominal pain.  No vomiting or diarrhea.  He states that he does not have a history of hypertension and thinks that his blood pressure today is elevated as he feels somewhat anxious.      Physical Exam   Triage Vital Signs: ED Triage Vitals [01/15/22 1538]  Enc Vitals Group     BP (!) 188/79     Pulse Rate 74     Resp 18     Temp 99.1 F (37.3 C)     Temp Source Oral     SpO2 96 %     Weight      Height      Head Circumference      Peak Flow      Pain Score      Pain Loc      Pain Edu?      Excl. in [place]?     Most recent vital signs: Vitals:   01/15/22 1538  BP: (!) 188/79  Pulse: 74  Resp: 18  Temp: 99.1 F (37.3 C)  SpO2: 96%     General: Alert and in no acute distress. Eyes:  PERRL. EOMI. Head: No acute traumatic findings ENT:      Ears:       Nose: No congestion/rhinnorhea.      Mouth/Throat: Mucous membranes are moist.  Neck: No stridor. No cervical spine tenderness to palpation. Cardiovascular:  Good peripheral perfusion Respiratory: Normal respiratory effort without tachypnea or retractions. Lungs CTAB. Good air entry to the bases with no decreased or absent breath sounds. Gastrointestinal: Bowel sounds 4 quadrants. Soft and nontender to palpation. No guarding or rigidity. No palpable masses. No distention. No CVA tenderness. Musculoskeletal: Full range of motion to all extremities.  Neurologic:  No gross focal neurologic deficits are appreciated.  Skin: Patient has tick bite wound along right groin  with surrounding erythema. Other:  ED Results / Procedures / Treatments   Labs (all labs ordered are listed, but only abnormal results are displayed) Labs Reviewed - No data to display      PROCEDURES:  Critical Care performed: No  Procedures   MEDICATIONS ORDERED IN ED: Medications - No data to display   IMPRESSION / MDM / ASSESSMENT AND PLAN / ED COURSE  I reviewed the triage vital signs and the nursing notes.                              Assessment and plan Tick bite 61 year old male presents to the urgent care with erythema surrounding a tick bite wound along with body aches and chills.  We will treat patient with doxycycline to cover him for [facility] spotted fever.  I examined the affected area and did not see any retained tick parts.  Return precautions were given to return with new or worsening symptoms.     FINAL CLINICAL IMPRESSION(S) / ED DIAGNOSES   Final diagnoses:  Tick  bite of right thigh, initial encounter     Rx / DC Orders   ED Discharge Orders          Ordered    doxycycline (VIBRAMYCIN) 100 MG capsule  2 times daily        01/15/22 1547             Note:  This document was prepared using Dragon voice recognition software and may include unintentional dictation errors.   Pia Mau [place], [place] 01/15/22 1553

## 2022-01-15 NOTE — ED Triage Notes (Signed)
Provider triage  

## 2022-11-05 ENCOUNTER — Ambulatory Visit
Admission: RE | Admit: 2022-11-05 | Discharge: 2022-11-05 | Disposition: A | Discharge status: Home / Self Care | Payer: BLUE CROSS/BLUE SHIELD | Source: Ambulatory Visit | Attending: Emergency Medicine | Admitting: Emergency Medicine

## 2022-11-05 VITALS — BP 139/82 | HR 72 | Temp 99.4°F | Resp 16

## 2022-11-05 DIAGNOSIS — R0982 Postnasal drip: Secondary | ICD-10-CM | POA: Diagnosis not present

## 2022-11-05 DIAGNOSIS — R051 Acute cough: Secondary | ICD-10-CM | POA: Diagnosis not present

## 2022-11-05 DIAGNOSIS — J302 Other seasonal allergic rhinitis: Secondary | ICD-10-CM

## 2022-11-05 MED ORDER — PREDNISONE 10 MG (21) PO TBPK: ORAL_TABLET | Freq: Every day | ORAL | 0 refills | Status: DC

## 2022-11-05 NOTE — ED Provider Notes (Signed)
[name]    CSN: [id number] Arrival date & time: 11/05/22  1110      History   Chief Complaint Chief Complaint  Patient presents with   Cough    Entered by patient    HPI Eric Fitzgerald is a 62 y.o. male.  Patient presents with 3-4 week history of postnasal drip and nonproductive cough.  Symptoms worsened after mowing grass 2 days ago.  He took Mucinex last night which helped; no OTC medications taken today.  Patient denies fever, chills, rash, ear pain, shortness of breath, chest pain, or other symptoms.  His medical history includes diabetes, obesity, hyperlipidemia.   The history is provided by the patient and medical records.    Past Medical History:  Diagnosis Date   HLD (hyperlipidemia)    OSA on CPAP 07/2015   sleep study in chart     Patient Active Problem List   Diagnosis Date Noted   OSA on CPAP 08/04/2014   Severe obesity (BMI 35.0-39.9) with comorbidity ([place]) 08/02/2014   Diabetes mellitus type 2, controlled, without complications [id number]   Benign prostatic hyperplasia 04/22/2011   Healthcare maintenance 04/16/2011   HLD (hyperlipidemia) 01/28/2007    Past Surgical History:  Procedure Laterality Date   KNEE ARTHROSCOPY  1981   Right   KNEE ARTHROSCOPY Right 09/2015   MRI showing torn meniscus - done at [facility] Medications    Prior to Admission medications   Medication Sig Start Date End Date Taking? Authorizing Provider  atorvastatin (LIPITOR) 40 MG tablet TAKE 1 TABLET BY MOUTH EVERY DAY 07/19/19  Yes Ria Bush, MD  metFORMIN (GLUCOPHAGE) 500 MG tablet TAKE 1 TABLET BY MOUTH EVERY DAY WITH BREAKFAST 07/19/19  Yes Ria Bush, MD  predniSONE (STERAPRED UNI-PAK 21 TAB) 10 MG (21) TBPK tablet Take by mouth daily. As directed 11/05/22  Yes Sharion Balloon, NP  aspirin EC 81 MG tablet Take 1 tablet (81 mg total) by mouth every Monday, Wednesday, and Friday. 08/12/17   Ria Bush, MD    Family  History Family History  Problem Relation Age of Onset   CAD Father 52       56v CABG   Other Mother 52       Brain tumor   Bipolar disorder Sister    Depression Sister    CAD Maternal Grandfather        MI   CAD Maternal Uncle        MI   AAA (abdominal aortic aneurysm) Father        smoker   Stroke Neg Hx    Diabetes Neg Hx     Social History Social History   Tobacco Use   Smoking status: Never   Smokeless tobacco: Never  Substance Use Topics   Alcohol use: Yes    Comment: Rare   Drug use: No     Allergies   Fenofibrate and Niacin   Review of Systems Review of Systems  Constitutional:  Negative for chills and fever.  HENT:  Positive for postnasal drip. Negative for ear pain and sore throat.   Respiratory:  Positive for cough. Negative for shortness of breath.   Cardiovascular:  Negative for chest pain and palpitations.  Skin:  Negative for rash.  All other systems reviewed and are negative.    Physical Exam Triage Vital Signs ED Triage Vitals [11/05/22 1128]  Enc Vitals Group     BP 139/82  Pulse Rate 72     Resp 16     Temp 99.4 F (37.4 C)     Temp Source Oral     SpO2 95 %     Weight      Height      Head Circumference      Peak Flow      Pain Score 0     Pain Loc      Pain Edu?      Excl. in [place]?    No data found.  Updated Vital Signs BP 139/82 (BP Location: Right Arm)   Pulse 72   Temp 99.4 F (37.4 C) (Oral)   Resp 16   SpO2 95%   Visual Acuity Right Eye Distance:   Left Eye Distance:   Bilateral Distance:    Right Eye Near:   Left Eye Near:    Bilateral Near:     Physical Exam Vitals and nursing note reviewed.  Constitutional:      General: He is not in acute distress.    Appearance: He is well-developed. He is obese. He is not ill-appearing.  HENT:     Right Ear: Tympanic membrane normal.     Left Ear: Tympanic membrane normal.     Nose: Nose normal.     Mouth/Throat:     Mouth: Mucous membranes are moist.      Pharynx: Oropharynx is clear.     Comments: Clear PND. Cardiovascular:     Rate and Rhythm: Normal rate and regular rhythm.     Heart sounds: Normal heart sounds.  Pulmonary:     Effort: Pulmonary effort is normal. No respiratory distress.     Breath sounds: Normal breath sounds.  Musculoskeletal:     Cervical back: Neck supple.  Skin:    General: Skin is warm and dry.  Neurological:     Mental Status: He is alert.  Psychiatric:        Mood and Affect: Mood normal.        Behavior: Behavior normal.      UC Treatments / Results  Labs (all labs ordered are listed, but only abnormal results are displayed) Labs Reviewed - No data to display  EKG   Radiology No results found.  Procedures Procedures (including critical care time)  Medications Ordered in UC Medications - No data to display  Initial Impression / Assessment and Plan / UC Course  I have reviewed the triage vital signs and the nursing notes.  Pertinent labs & imaging results that were available during my care of the patient were reviewed by me and considered in my medical decision making (see chart for details).   Seasonal allergies, postnasal drip, cough.  Treating with prednisone taper.  Instructed patient to start Flonase nasal spray and Zyrtec daily during allergy season.  Education provided on allergic rhinitis, postnasal drip, cough.  Instructed patient to follow up with his PCP if his symptoms are not improving.  He agrees to plan of care.     Final Clinical Impressions(s) / UC Diagnoses   Final diagnoses:  Seasonal allergies  Postnasal drip  Acute cough     Discharge Instructions      Take the prednisone as directed.    Use Flonase nasal spray and take Zyrtec as directed.    Follow up with your primary care provider if your symptoms are not improving.        ED Prescriptions     Medication Sig Dispense Auth. Provider  predniSONE (STERAPRED UNI-PAK 21 TAB) 10 MG (21) TBPK tablet  Take by mouth daily. As directed 21 tablet Sharion Balloon, NP      PDMP not reviewed this encounter.   Sharion Balloon, NP 11/05/22 [phone]

## 2022-11-05 NOTE — Discharge Instructions (Addendum)
Take the prednisone as directed.    Use Flonase nasal spray and take Zyrtec as directed.    Follow up with your primary care provider if your symptoms are not improving.    

## 2022-11-05 NOTE — ED Triage Notes (Signed)
Cough, fatigue, nasal drainage, that started 3 weeks ago, went away then came right back 2 days ago. Got worse when pt went outside and mowed grass. Taking mucinex and states that helps.

## 2023-04-14 ENCOUNTER — Ambulatory Visit
Admission: RE | Admit: 2023-04-14 | Discharge: 2023-04-14 | Disposition: A | Discharge status: Home / Self Care | Payer: BLUE CROSS/BLUE SHIELD | Source: Ambulatory Visit | Attending: Emergency Medicine | Admitting: Emergency Medicine

## 2023-04-14 VITALS — BP 169/83 | HR 64 | Temp 98.6°F | Resp 18

## 2023-04-14 DIAGNOSIS — L02415 Cutaneous abscess of right lower limb: Secondary | ICD-10-CM | POA: Diagnosis not present

## 2023-04-14 MED ORDER — DOXYCYCLINE HYCLATE 100 MG PO CAPS: 100.0000 mg | ORAL_CAPSULE | Freq: Two times a day (BID) | ORAL | 0 refills | Status: AC

## 2023-04-14 NOTE — ED Triage Notes (Signed)
Patient to Urgent Care with complaints of a sore on his right thigh.  Symptoms started three days ago. Redness surrounding the area. No drainage.

## 2023-04-14 NOTE — ED Provider Notes (Signed)
Eric Fitzgerald    CSN: [id number] Arrival date & time: 04/14/23  1235      History   Chief Complaint Chief Complaint  Patient presents with   Leg Pain    Infected sore on leg - Entered by patient    HPI Eric Fitzgerald is a 62 y.o. male.   Patient presents for evaluation of a sore to his right thigh that became tender with surrounding redness 3 days ago.  Concern with infection.  Has attempted to cleanse with alcohol and applied Neosporin but has seen no improvement.  Denies presence of drainage or fever.  Endorses lesion was there prior that he believes he has gotten due to old age as there are several other similar lesions across the body, may have scratched accidentally.  Past Medical History:  Diagnosis Date   HLD (hyperlipidemia)    OSA on CPAP 07/2015   sleep study in chart     Patient Active Problem List   Diagnosis Date Noted   OSA on CPAP 08/04/2014   Severe obesity (BMI 35.0-39.9) with comorbidity (HCC) 08/02/2014   Diabetes mellitus type 2, controlled, without complications (HCC) 04/22/2011   Benign prostatic hyperplasia 04/22/2011   Healthcare maintenance 04/16/2011   HLD (hyperlipidemia) 01/28/2007    Past Surgical History:  Procedure Laterality Date   KNEE ARTHROSCOPY  1981   Right   KNEE ARTHROSCOPY Right 09/2015   [place]       Home Medications    Prior to Admission medications   Medication Sig Start Date End Date Taking? Authorizing Provider  doxycycline (VIBRAMYCIN) 100 MG capsule Take 1 capsule (100 mg total) by mouth 2 (two) times daily for 10 days. 04/14/23 04/24/23 Yes Valinda Hoar, NP  aspirin EC 81 MG tablet Take 1 tablet (81 mg total) by mouth every Monday, Wednesday, and Friday. 08/12/17   Eustaquio Boyden, MD  atorvastatin (LIPITOR) 40 MG tablet TAKE 1 TABLET BY MOUTH EVERY DAY 07/19/19   Eustaquio Boyden, MD  metFORMIN (GLUCOPHAGE) 500 MG tablet TAKE 1 TABLET BY MOUTH EVERY DAY WITH  BREAKFAST 07/19/19   Eustaquio Boyden, MD  predniSONE (STERAPRED UNI-PAK 21 TAB) 10 MG (21) TBPK tablet Take by mouth daily. As directed 11/05/22   Mickie Bail, NP    Family History Family History  Problem Relation Age of Onset   CAD Father 55       53v CABG   Other Mother 64       Brain tumor   Bipolar disorder Sister    Depression Sister    CAD Maternal Grandfather        MI   CAD Maternal Uncle        MI   AAA (abdominal aortic aneurysm) Father        smoker   Stroke Neg Hx    Diabetes Neg Hx     Social History Social History   Tobacco Use   Smoking status: Never   Smokeless tobacco: Never  Substance Use Topics   Alcohol use: Yes    Comment: Rare   Drug use: No     Allergies   Fenofibrate and Niacin   Review of Systems Review of Systems   Physical Exam Triage Vital Signs ED Triage Vitals  Encounter Vitals Group     BP 04/14/23 1304 (!) 169/83     Systolic BP Percentile --      Diastolic BP Percentile --  Pulse Rate 04/14/23 1304 64     Resp 04/14/23 1304 18     Temp 04/14/23 1304 98.6 F (37 C)     Temp Source 04/14/23 1304 Oral     SpO2 04/14/23 1304 97 %     Weight --      Height --      Head Circumference --      Peak Flow --      Pain Score 04/14/23 1256 3     Pain Loc --      Pain Education --      Exclude from Growth Chart --    No data found.  Updated Vital Signs BP (!) 169/83 (BP Location: Left Arm)   Pulse 64   Temp 98.6 F (37 C) (Oral)   Resp 18   SpO2 97%   Visual Acuity Right Eye Distance:   Left Eye Distance:   Bilateral Distance:    Right Eye Near:   Left Eye Near:    Bilateral Near:     Physical Exam Constitutional:      Appearance: Normal appearance.  Eyes:     Extraocular Movements: Extraocular movements intact.  Pulmonary:     Effort: Pulmonary effort is normal.  Skin:    Comments: Keratosis lesion present to the medial aspect of the right thigh with surrounding erythema, firmness and indurated 1 x  2 cm abscess underneath the skin, skin hot to touch, no drainage noted  Neurological:     Mental Status: He is alert and oriented to person, place, and time. Mental status is at baseline.      UC Treatments / Results  Labs (all labs ordered are listed, but only abnormal results are displayed) Labs Reviewed - No data to display  EKG   Radiology No results found.  Procedures Procedures (including critical care time)  Medications Ordered in UC Medications - No data to display  Initial Impression / Assessment and Plan / UC Course  I have reviewed the triage vital signs and the nursing notes.  Pertinent labs & imaging results that were available during my care of the patient were reviewed by me and considered in my medical decision making (see chart for details).  Abscess of the right thigh  Immature abscess on exam, unable to complete I&D, discussed with patient, placed on doxycycline, extended course from 7 to 10 days due to history of diabetes, recommended warm compresses and over-the-counter Tylenol for home management, given precautions to follow-up if symptoms for nonhealing nondraining site for reevaluation, site drains at home advised to cleanse daily and cover with a nonstick Band-Aid until healed  Final diagnoses:  Abscess of right thigh     Discharge Instructions      Abscess at this time is still firm and cannot be drained, please monitor at home, if the area becomes soft and tender and comes to the head similar to a pimple and does not drain on its own you may return to the clinic for an assisted drainage  Take doxycycline every morning and every evening for 10 days  You will only need to clean the wound if it is draining, if this occurs clean with soap and water daily, pat dry and then cover with a nonstick Band-Aid until healed  If site is not draining you do not have to clean or apply any topical medicines  May take Tylenol as needed for pain  Hold  warm-hot compresses to affected area at least 4 times a  day, this helps to facilitate draining, the more the better  Please return for evaluation for increased swelling, increased tenderness or pain, non healing site, non draining site, you begin to have fever or chills   We reviewed the etiology of recurrent abscesses of skin.  Skin abscesses are collections of pus within the dermis and deeper skin tissues. Skin abscesses manifest as painful, tender, fluctuant, and erythematous nodules, frequently surmounted by a pustule and surrounded by a rim of erythematous swelling.  Spontaneous drainage of purulent material may occur.  Fever can occur on occasion.    -Skin abscesses can develop in healthy individuals with no predisposing conditions other than skin or nasal carriage of Staphylococcus aureus.  Individuals in close contact with others who have active infection with skin abscesses are at increased risk which is likely to explain why twin brother has similar episodes.   In addition, any process leading to a breach in the skin barrier can also predispose to the development of a skin abscesses, such as atopic dermatitis.      ED Prescriptions     Medication Sig Dispense Auth. Provider   doxycycline (VIBRAMYCIN) 100 MG capsule Take 1 capsule (100 mg total) by mouth 2 (two) times daily for 10 days. 20 capsule Valinda Hoar, NP      PDMP not reviewed this encounter.   Valinda Hoar, NP 04/14/23 1350

## 2023-04-14 NOTE — Discharge Instructions (Signed)
Abscess at this time is still firm and cannot be drained, please monitor at home, if the area becomes soft and tender and comes to the head similar to a pimple and does not drain on its own you may return to the clinic for an assisted drainage  Take doxycycline every morning and every evening for 10 days  You will only need to clean the wound if it is draining, if this occurs clean with soap and water daily, pat dry and then cover with a nonstick Band-Aid until healed  If site is not draining you do not have to clean or apply any topical medicines  May take Tylenol as needed for pain  Hold warm-hot compresses to affected area at least 4 times a day, this helps to facilitate draining, the more the better  Please return for evaluation for increased swelling, increased tenderness or pain, non healing site, non draining site, you begin to have fever or chills   We reviewed the etiology of recurrent abscesses of skin.  Skin abscesses are collections of pus within the dermis and deeper skin tissues. Skin abscesses manifest as painful, tender, fluctuant, and erythematous nodules, frequently surmounted by a pustule and surrounded by a rim of erythematous swelling.  Spontaneous drainage of purulent material may occur.  Fever can occur on occasion.    -Skin abscesses can develop in healthy individuals with no predisposing conditions other than skin or nasal carriage of Staphylococcus aureus.  Individuals in close contact with others who have active infection with skin abscesses are at increased risk which is likely to explain why twin brother has similar episodes.   In addition, any process leading to a breach in the skin barrier can also predispose to the development of a skin abscesses, such as atopic dermatitis.

## 2023-09-06 ENCOUNTER — Ambulatory Visit
Admission: RE | Admit: 2023-09-06 | Discharge: 2023-09-06 | Disposition: A | Discharge status: Home / Self Care | Payer: BLUE CROSS/BLUE SHIELD | Source: Ambulatory Visit | Attending: Emergency Medicine | Admitting: Emergency Medicine

## 2023-09-06 VITALS — BP 159/78 | HR 65 | Temp 98.8°F | Resp 16

## 2023-09-06 DIAGNOSIS — H109 Unspecified conjunctivitis: Secondary | ICD-10-CM

## 2023-09-06 DIAGNOSIS — H00012 Hordeolum externum right lower eyelid: Secondary | ICD-10-CM

## 2023-09-06 MED ORDER — MOXIFLOXACIN HCL 0.5 % OP SOLN: 1.0000 [drp] | Freq: Three times a day (TID) | OPHTHALMIC | 0 refills | Status: DC

## 2023-09-06 NOTE — Discharge Instructions (Addendum)
Today you being treated for bacterial conjunctivitis and a stye  Styes typically resolve within a 2 to 3-week.,  If your stye continues to persist please follow-up with eye doctor, information listed on front page  Place one drop of moxifloxacin into the effected eye every 8 hours while awake for 7 days. If the other eye starts to have symptoms you may use medication in it as well. Do not allow tip of dropper to touch eye.  May use cool compress for comfort and to remove discharge if present. Pat the eye, do not wipe.  Do not rub eyes, this may cause more irritation.  May use Claritin, Zyrtec or benadryl as needed to help if itching present.  If symptoms persist after use of medication, please follow up at Urgent Care or with ophthalmologist (eye doctor)

## 2023-09-06 NOTE — ED Provider Notes (Signed)
[name]    CSN: [id number] Arrival date & time: 09/06/23  1016      History   Chief Complaint Chief Complaint  Patient presents with  . Eye Problem    Swollen and itchy - Entered by patient    HPI Eric Fitzgerald is a 63 y.o. male.   Patient presents for evaluation of erythema, drainage and pruritus present to the right eye for 7 days.  Has attempted use of over-the-counter eyedrops which have been ineffective.  Known exposure to pinkeye 3 to 4 weeks ago.  Denies injury or trauma, visual disturbance, light sensitivity.  Denies use of contacts.  Past Medical History:  Diagnosis Date  . HLD (hyperlipidemia)   . OSA on CPAP 07/2015   sleep study in chart     Patient Active Problem List   Diagnosis Date Noted  . OSA on CPAP 08/04/2014  . Severe obesity (BMI 35.0-39.9) with comorbidity (HCC) 08/02/2014  . Diabetes mellitus type 2, controlled, without complications (HCC) 04/22/2011  . Benign prostatic hyperplasia 04/22/2011  . Healthcare maintenance 04/16/2011  . HLD (hyperlipidemia) 01/28/2007    Past Surgical History:  Procedure Laterality Date  . KNEE ARTHROSCOPY  1981   Right  . KNEE ARTHROSCOPY Right 09/2015   [place]       Home Medications    Prior to Admission medications   Medication Sig Start Date End Date Taking? Authorizing Provider  moxifloxacin (VIGAMOX) 0.5 % ophthalmic solution Place 1 drop into the right eye 3 (three) times daily. 09/06/23  Yes Valinda Hoar, NP  aspirin EC 81 MG tablet Take 1 tablet (81 mg total) by mouth every Monday, Wednesday, and Friday. 08/12/17   Eustaquio Boyden, MD  atorvastatin (LIPITOR) 40 MG tablet TAKE 1 TABLET BY MOUTH EVERY DAY 07/19/19   Eustaquio Boyden, MD  metFORMIN (GLUCOPHAGE) 500 MG tablet TAKE 1 TABLET BY MOUTH EVERY DAY WITH BREAKFAST 07/19/19   Eustaquio Boyden, MD  predniSONE (STERAPRED UNI-PAK 21 TAB) 10 MG (21) TBPK tablet Take by mouth daily. As directed  11/05/22   Mickie Bail, NP    Family History Family History  Problem Relation Age of Onset  . CAD Father 100       5v CABG  . Other Mother 73       Brain tumor  . Bipolar disorder Sister   . Depression Sister   . CAD Maternal Grandfather        MI  . CAD Maternal Uncle        MI  . AAA (abdominal aortic aneurysm) Father        smoker  . Stroke Neg Hx   . Diabetes Neg Hx     Social History Social History   Tobacco Use  . Smoking status: Never  . Smokeless tobacco: Never  Vaping Use  . Vaping status: Never Used  Substance Use Topics  . Alcohol use: Yes    Comment: Rare  . Drug use: No     Allergies   Fenofibrate and Niacin   Review of Systems Review of Systems   Physical Exam Triage Vital Signs ED Triage Vitals  Encounter Vitals Group     BP 09/06/23 1031 (!) 159/78     Systolic BP Percentile --      Diastolic BP Percentile --      Pulse Rate 09/06/23 1031 65     Resp 09/06/23 1031 16     Temp  09/06/23 1031 98.8 F (37.1 C)     Temp Source 09/06/23 1031 Oral     SpO2 09/06/23 1031 96 %     Weight --      Height --      Head Circumference --      Peak Flow --      Pain Score 09/06/23 1030 0     Pain Loc --      Pain Education --      Exclude from Growth Chart --    No data found.  Updated Vital Signs BP (!) 159/78 (BP Location: Right Arm)   Pulse 65   Temp 98.8 F (37.1 C) (Oral)   Resp 16   SpO2 96%   Visual Acuity Right Eye Distance:   Left Eye Distance:   Bilateral Distance:    Right Eye Near:   Left Eye Near:    Bilateral Near:     Physical Exam Constitutional:      Appearance: Normal appearance.  Eyes:      Comments: Stye present to the right lower eyelid with surrounding erythema and swelling, drainage noted to the lower lash line, erythema to the right conjunctiva, vision grossly intact, extraocular movements intact  Pulmonary:     Effort: Pulmonary effort is normal.  Neurological:     Mental Status: He is alert and  oriented to person, place, and time. Mental status is at baseline.     UC Treatments / Results  Labs (all labs ordered are listed, but only abnormal results are displayed) Labs Reviewed - No data to display  EKG   Radiology No results found.  Procedures Procedures (including critical care time)  Medications Ordered in UC Medications - No data to display  Initial Impression / Assessment and Plan / UC Course  I have reviewed the triage vital signs and the nursing notes.  Pertinent labs & imaging results that were available during my care of the patient were reviewed by me and considered in my medical decision making (see chart for details).  Bacterial conjunctivitis of right eye, hordeolum externum right lower eyelid  Presentation and symptomology consistent with above diagnosis, prescribed moxifloxacin and discussed administration recommended supportive care for management of pruritus as well as pain, given walker referral to ophthalmology if symptoms continue to persist Final Clinical Impressions(s) / UC Diagnoses   Final diagnoses:  Bacterial conjunctivitis of right eye  Hordeolum externum right lower eyelid     Discharge Instructions      Today you being treated for bacterial conjunctivitis and a stye  Styes typically resolve within a 2 to 3-week.,  If your stye continues to persist please follow-up with eye doctor, information listed on front page  Place one drop of moxifloxacin into the effected eye every 8 hours while awake for 7 days. If the other eye starts to have symptoms you may use medication in it as well. Do not allow tip of dropper to touch eye.  May use cool compress for comfort and to remove discharge if present. Pat the eye, do not wipe.  Do not rub eyes, this may cause more irritation.  May use Claritin, Zyrtec or benadryl as needed to help if itching present.  If symptoms persist after use of medication, please follow up at Urgent Care or with  ophthalmologist (eye doctor)    ED Prescriptions     Medication Sig Dispense Auth. Provider   moxifloxacin (VIGAMOX) 0.5 % ophthalmic solution Place 1 drop into the right eye  3 (three) times daily. 3 mL Valinda Hoar, NP      PDMP not reviewed this encounter.   Valinda Hoar, NP 09/06/23 1051

## 2023-09-06 NOTE — ED Triage Notes (Signed)
Pt presents with right eye redness, itchy and drainage x 1 week.

## 2023-09-25 ENCOUNTER — Ambulatory Visit
Admission: RE | Admit: 2023-09-25 | Discharge: 2023-09-25 | Disposition: A | Discharge status: Home / Self Care | Payer: BLUE CROSS/BLUE SHIELD | Source: Ambulatory Visit | Attending: Emergency Medicine | Admitting: Emergency Medicine

## 2023-09-25 VITALS — BP 150/83 | HR 82 | Temp 99.0°F | Resp 17

## 2023-09-25 DIAGNOSIS — J3489 Other specified disorders of nose and nasal sinuses: Secondary | ICD-10-CM | POA: Diagnosis not present

## 2023-09-25 MED ORDER — MUPIROCIN 2 % EX OINT: 1.0000 | TOPICAL_OINTMENT | Freq: Two times a day (BID) | CUTANEOUS | 0 refills | Status: DC

## 2023-09-25 MED ORDER — CEPHALEXIN 500 MG PO CAPS: 500.0000 mg | ORAL_CAPSULE | Freq: Four times a day (QID) | ORAL | 0 refills | Status: DC

## 2023-09-25 NOTE — ED Provider Notes (Signed)
 [name]    CSN: [id number] Arrival date & time: 09/25/23  0802      History   Chief Complaint Chief Complaint  Patient presents with   Facial Pain    HPI ZENO HICKEL is a 63 y.o. male.  Patient presents with 4-day history of painful red swollen area in and around his left nostril.  No wounds or drainage.  No fever.  He took Tylenol this morning.  The history is provided by the patient and medical records.    Past Medical History:  Diagnosis Date   HLD (hyperlipidemia)    OSA on CPAP 07/2015   sleep study in chart     Patient Active Problem List   Diagnosis Date Noted   OSA on CPAP 08/04/2014   Severe obesity (BMI 35.0-39.9) with comorbidity (HCC) 08/02/2014   Diabetes mellitus type 2, controlled, without complications (HCC) 04/22/2011   Benign prostatic hyperplasia 04/22/2011   Healthcare maintenance 04/16/2011   HLD (hyperlipidemia) 01/28/2007    Past Surgical History:  Procedure Laterality Date   KNEE ARTHROSCOPY  1981   Right   KNEE ARTHROSCOPY Right 09/2015   [place]       Home Medications    Prior to Admission medications   Medication Sig Start Date End Date Taking? Authorizing Provider  aspirin EC 81 MG tablet Take 1 tablet (81 mg total) by mouth every Monday, Wednesday, and Friday. Patient not taking: Reported on 09/25/2023 08/12/17   Eustaquio Boyden, MD  atorvastatin (LIPITOR) 40 MG tablet TAKE 1 TABLET BY MOUTH EVERY DAY 07/19/19   Eustaquio Boyden, MD  cephALEXin (KEFLEX) 500 MG capsule Take 1 capsule (500 mg total) by mouth 4 (four) times daily. 09/25/23  Yes Mickie Bail, NP  metFORMIN (GLUCOPHAGE) 500 MG tablet TAKE 1 TABLET BY MOUTH EVERY DAY WITH BREAKFAST 07/19/19   Eustaquio Boyden, MD  moxifloxacin (VIGAMOX) 0.5 % ophthalmic solution Place 1 drop into the right eye 3 (three) times daily. Patient not taking: Reported on 09/25/2023 09/06/23   Valinda Hoar, NP  mupirocin ointment  (BACTROBAN) 2 % Apply 1 Application topically 2 (two) times daily. 09/25/23  Yes Mickie Bail, NP  predniSONE (STERAPRED UNI-PAK 21 TAB) 10 MG (21) TBPK tablet Take by mouth daily. As directed Patient not taking: Reported on 09/25/2023 11/05/22   Mickie Bail, NP    Family History Family History  Problem Relation Age of Onset   CAD Father 90       67v CABG   Other Mother 40       Brain tumor   Bipolar disorder Sister    Depression Sister    CAD Maternal Grandfather        MI   CAD Maternal Uncle        MI   AAA (abdominal aortic aneurysm) Father        smoker   Stroke Neg Hx    Diabetes Neg Hx     Social History Social History   Tobacco Use   Smoking status: Never   Smokeless tobacco: Never  Vaping Use   Vaping status: Never Used  Substance Use Topics   Alcohol use: Yes    Comment: Rare   Drug use: No     Allergies   Fenofibrate and Niacin   Review of Systems Review of Systems  Constitutional:  Negative for chills and fever.  HENT:  Negative for nosebleeds and rhinorrhea.   Respiratory:  Negative for cough and shortness of breath.   Skin:  Positive for color change and wound.     Physical Exam Triage Vital Signs ED Triage Vitals  Encounter Vitals Group     BP      Systolic BP Percentile      Diastolic BP Percentile      Pulse      Resp      Temp      Temp src      SpO2      Weight      Height      Head Circumference      Peak Flow      Pain Score      Pain Loc      Pain Education      Exclude from Growth Chart    No data found.  Updated Vital Signs BP (!) 150/83   Pulse 82   Temp 99 F (37.2 C)   Resp 17   SpO2 95%   Visual Acuity Right Eye Distance:   Left Eye Distance:   Bilateral Distance:    Right Eye Near:   Left Eye Near:    Bilateral Near:     Physical Exam Constitutional:      General: He is not in acute distress. HENT:     Nose:     Comments: Tender erythematous area of induration just inside left nostril with  swelling of left upper lip.     Mouth/Throat:     Mouth: Mucous membranes are moist.  Cardiovascular:     Rate and Rhythm: Normal rate and regular rhythm.  Pulmonary:     Effort: Pulmonary effort is normal. No respiratory distress.  Neurological:     Mental Status: He is alert.      UC Treatments / Results  Labs (all labs ordered are listed, but only abnormal results are displayed) Labs Reviewed - No data to display  EKG   Radiology No results found.  Procedures Procedures (including critical care time)  Medications Ordered in UC Medications - No data to display  Initial Impression / Assessment and Plan / UC Course  I have reviewed the triage vital signs and the nursing notes.  Pertinent labs & imaging results that were available during my care of the patient were reviewed by me and considered in my medical decision making (see chart for details).    Nostril sore, nostril infection.  Treating with Keflex and mupirocin ointment.  Discussed warm compresses.  Instructed patient to follow-up with his PCP if his symptoms are not improving.  He agrees to plan of care.  Final Clinical Impressions(s) / UC Diagnoses   Final diagnoses:  Nostril sore  Nostril infection     Discharge Instructions      Take the Keflex as directed.  Apply the mupirocin ointment as directed.  Follow-up with your primary care provider if your symptoms are not improving.      ED Prescriptions     Medication Sig Dispense Auth. Provider   cephALEXin (KEFLEX) 500 MG capsule Take 1 capsule (500 mg total) by mouth 4 (four) times daily. 28 capsule Wendee Beavers H, NP   mupirocin ointment (BACTROBAN) 2 % Apply 1 Application topically 2 (two) times daily. 22 g Mickie Bail, NP      PDMP not reviewed this encounter.   Mickie Bail, NP 09/25/23 [phone]

## 2023-09-25 NOTE — Discharge Instructions (Signed)
 Take the Keflex as directed.  Apply the mupirocin ointment as directed.  Follow-up with your primary care provider if your symptoms are not improving.

## 2023-09-25 NOTE — ED Triage Notes (Signed)
 Patient to Urgent Care with complaints of a skin infection on the inside of his left nostril. Swelling extends into his upper lip.   Symptoms started 4-5 days ago. No drainage. Denies any fevers.   Sleeps with a CPAP.

## 2023-12-10 LAB — PSA: PSA: 0.87

## 2023-12-10 LAB — MICROALBUMIN / CREATININE URINE RATIO: Microalb Creat Ratio: 5

## 2023-12-10 LAB — VITAMIN D 25 HYDROXY (VIT D DEFICIENCY, FRACTURES): Vit D, 25-Hydroxy: 30.7

## 2023-12-10 LAB — PROTEIN / CREATININE RATIO, URINE
Albumin, U: 12
Creatinine, Urine: 234

## 2023-12-10 LAB — COMPREHENSIVE METABOLIC PANEL WITH GFR: Albumin: 4.6 (ref 3.5–5.0)

## 2023-12-20 ENCOUNTER — Ambulatory Visit
Admission: EM | Admit: 2023-12-20 | Discharge: 2023-12-20 | Disposition: A | Discharge status: Home / Self Care | Attending: Emergency Medicine | Admitting: Emergency Medicine

## 2023-12-20 DIAGNOSIS — H00015 Hordeolum externum left lower eyelid: Secondary | ICD-10-CM

## 2023-12-20 MED ORDER — POLYMYXIN B-TRIMETHOPRIM 10000-0.1 UNIT/ML-% OP SOLN: 1.0000 [drp] | Freq: Four times a day (QID) | OPHTHALMIC | 0 refills | Status: AC

## 2023-12-20 NOTE — Discharge Instructions (Addendum)
Use the antibiotic eyedrops as prescribed.    Follow-up with your primary care provider if your symptoms are not improving.    Go to the emergency department if you have acute eye pain, changes in your vision, or other concerning symptoms.    

## 2023-12-20 NOTE — ED Triage Notes (Signed)
 Patient to Urgent Care with complaints of left sided eye redness/ bump on the lower eyelid/ drainage/ itching.  Symptoms x3-4 days ago.

## 2023-12-20 NOTE — ED Provider Notes (Signed)
 Eric Fitzgerald    CSN: [id number] Arrival date & time: 12/20/23  0858      History   Chief Complaint Chief Complaint  Patient presents with   Eye Problem    HPI Eric Fitzgerald is a 63 y.o. male.  Patient presents with tender red bump on his left lower eyelid x 3 days.  No treatments at home.  He reports redness, itching, drainage in his left eye x 2 days.  No eye trauma, eye pain, change in vision, fever.  The history is provided by the patient and medical records.    Past Medical History:  Diagnosis Date   HLD (hyperlipidemia)    OSA on CPAP 07/2015   sleep study in chart     Patient Active Problem List   Diagnosis Date Noted   OSA on CPAP 08/04/2014   Severe obesity (BMI 35.0-39.9) with comorbidity (HCC) 08/02/2014   Diabetes mellitus type 2, controlled, without complications (HCC) 04/22/2011   Benign prostatic hyperplasia 04/22/2011   Healthcare maintenance 04/16/2011   HLD (hyperlipidemia) 01/28/2007    Past Surgical History:  Procedure Laterality Date   KNEE ARTHROSCOPY  1981   Right   KNEE ARTHROSCOPY Right 09/2015   [place]       Home Medications    Prior to Admission medications   Medication Sig Start Date End Date Taking? Authorizing Provider  trimethoprim-polymyxin b (POLYTRIM) ophthalmic solution Place 1 drop into the right eye 4 (four) times daily for 7 days. 12/20/23 12/27/23 Yes Wellington Half, NP  aspirin  EC 81 MG tablet Take 1 tablet (81 mg total) by mouth every Monday, Wednesday, and Friday. Patient not taking: Reported on 09/25/2023 08/12/17   Claire Crick, MD  atorvastatin  (LIPITOR) 40 MG tablet TAKE 1 TABLET BY MOUTH EVERY DAY 07/19/19   Claire Crick, MD  cephALEXin  (KEFLEX ) 500 MG capsule Take 1 capsule (500 mg total) by mouth 4 (four) times daily. Patient not taking: Reported on 12/20/2023 09/25/23   Wellington Half, NP  metFORMIN  (GLUCOPHAGE ) 500 MG tablet TAKE 1 TABLET BY MOUTH EVERY DAY  WITH BREAKFAST 07/19/19   Claire Crick, MD  mupirocin  ointment (BACTROBAN ) 2 % Apply 1 Application topically 2 (two) times daily. 09/25/23   Wellington Half, NP  predniSONE  (STERAPRED UNI-PAK 21 TAB) 10 MG (21) TBPK tablet Take by mouth daily. As directed Patient not taking: Reported on 09/25/2023 11/05/22   Wellington Half, NP    Family History Family History  Problem Relation Age of Onset   CAD Father 56       28v CABG   Other Mother 43       Brain tumor   Bipolar disorder Sister    Depression Sister    CAD Maternal Grandfather        MI   CAD Maternal Uncle        MI   AAA (abdominal aortic aneurysm) Father        smoker   Stroke Neg Hx    Diabetes Neg Hx     Social History Social History   Tobacco Use   Smoking status: Never   Smokeless tobacco: Never  Vaping Use   Vaping status: Never Used  Substance Use Topics   Alcohol use: Yes    Comment: Rare   Drug use: No     Allergies   Fenofibrate and Niacin   Review of Systems Review of Systems  Constitutional:  Negative for  chills and fever.  Eyes:  Positive for discharge, redness and itching. Negative for pain and visual disturbance.     Physical Exam Triage Vital Signs ED Triage Vitals  Encounter Vitals Group     BP 12/20/23 0931 132/87     Systolic BP Percentile --      Diastolic BP Percentile --      Pulse Rate 12/20/23 0931 86     Resp 12/20/23 0931 18     Temp 12/20/23 0931 98 F (36.7 C)     Temp src --      SpO2 12/20/23 0931 95 %     Weight --      Height --      Head Circumference --      Peak Flow --      Pain Score 12/20/23 0930 4     Pain Loc --      Pain Education --      Exclude from Growth Chart --    No data found.  Updated Vital Signs BP 132/87   Pulse 86   Temp 98 F (36.7 C)   Resp 18   SpO2 95%   Visual Acuity Right Eye Distance: 20/40 Left Eye Distance: 20/40 Bilateral Distance: 20/40  Right Eye Near:   Left Eye Near:    Bilateral Near:     Physical  Exam Constitutional:      General: He is not in acute distress. HENT:     Mouth/Throat:     Mouth: Mucous membranes are moist.  Eyes:     General: Vision grossly intact.        Left eye: No discharge.     Extraocular Movements: Extraocular movements intact.     Conjunctiva/sclera:     Right eye: Right conjunctiva is not injected.     Left eye: Left conjunctiva is injected.     Pupils: Pupils are equal, round, and reactive to light.   Cardiovascular:     Rate and Rhythm: Normal rate and regular rhythm.  Pulmonary:     Effort: Pulmonary effort is normal. No respiratory distress.  Neurological:     Mental Status: He is alert.      UC Treatments / Results  Labs (all labs ordered are listed, but only abnormal results are displayed) Labs Reviewed - No data to display  EKG   Radiology No results found.  Procedures Procedures (including critical care time)  Medications Ordered in UC Medications - No data to display  Initial Impression / Assessment and Plan / UC Course  I have reviewed the triage vital signs and the nursing notes.  Pertinent labs & imaging results that were available during my care of the patient were reviewed by me and considered in my medical decision making (see chart for details).    Left lower eyelid hordeolum.  No eye trauma, eye pain, change in vision.  Afebrile and vital signs are stable.  Treating with Polytrim eyedrops and warm compresses.  Education provided on stye.  Instructed patient to follow-up with his PCP or eye care provider if his symptoms are not improving.  ED precautions given.  He agrees to plan of care.  Final Clinical Impressions(s) / UC Diagnoses   Final diagnoses:  Hordeolum externum of left lower eyelid     Discharge Instructions      Use the antibiotic eyedrops as prescribed.    Follow-up with your primary care provider if your symptoms are not improving.    Go to  the emergency department if you have acute eye pain,  changes in your vision, or other concerning symptoms.      ED Prescriptions     Medication Sig Dispense Auth. Provider   trimethoprim-polymyxin b (POLYTRIM) ophthalmic solution Place 1 drop into the right eye 4 (four) times daily for 7 days. 10 mL Wellington Half, NP      PDMP not reviewed this encounter.   Wellington Half, NP 12/20/23 1022

## 2023-12-21 ENCOUNTER — Ambulatory Visit: Payer: Self-pay

## 2024-03-07 ENCOUNTER — Encounter: Payer: Self-pay | Admitting: Family Medicine

## 2024-03-07 ENCOUNTER — Ambulatory Visit: Admitting: Family Medicine

## 2024-03-07 VITALS — BP 136/78 | HR 67 | Temp 98.7°F | Ht 70.71 in | Wt 286.0 lb

## 2024-03-07 DIAGNOSIS — Z7189 Other specified counseling: Secondary | ICD-10-CM | POA: Diagnosis not present

## 2024-03-07 DIAGNOSIS — E119 Type 2 diabetes mellitus without complications: Secondary | ICD-10-CM | POA: Diagnosis not present

## 2024-03-07 NOTE — Progress Notes (Unsigned)
 Establish care.  New patient.   Diabetes:  Using medications without difficulties: yes Hypoglycemic episodes:no Hyperglycemic episodes:no Feet problems:no Blood Sugars averaging: usually ~100-120 eye exam within last year:yes On atorvastatin .    Most recent creatinine is 1, A1c 6.7.  H/o R cataract surgery.    FH AAA noted.  FH prostate cancer.   History of sleep apnea.  Using CPAP.  Compliant.  Advance directive discussed with patient.  Wife and both children all equally designated if patient were incapacitated.  PMH and SH reviewed  Meds, vitals, and allergies reviewed.   ROS: Per HPI unless specifically indicated in ROS section   GEN: nad, alert and oriented HEENT:  NECK: supple w/o LA CV: rrr. PULM: ctab, no inc wob ABD: soft, +bs EXT: no edema SKIN: no acute rash  Diabetic foot exam: Normal inspection No skin breakdown No calluses  Normal DP pulses Normal sensation to light touch and monofilament Nails normal

## 2024-03-07 NOTE — Patient Instructions (Signed)
 Don't change your meds for now.  I would get a flu shot each fall.   Recheck at a visit in about 3 months.  You don't need to fast.  We can check an A1c at the visit.  Take care.  Glad to see you.

## 2024-03-09 ENCOUNTER — Encounter: Payer: Self-pay | Admitting: Family Medicine

## 2024-03-09 DIAGNOSIS — Z7189 Other specified counseling: Secondary | ICD-10-CM | POA: Insufficient documentation

## 2024-03-09 NOTE — Assessment & Plan Note (Signed)
 Advance directive discussed with patient.  Wife and both children all equally designated if patient were incapacitated.

## 2024-03-09 NOTE — Assessment & Plan Note (Signed)
 Continue atorvastatin  and metformin .  Continue work on diet and exercise recheck periodically.  Rationale for both medications discussed with patient.  He can update me as needed.

## 2024-03-16 ENCOUNTER — Encounter: Payer: Self-pay | Admitting: Family Medicine

## 2024-04-08 ENCOUNTER — Ambulatory Visit
Admission: RE | Admit: 2024-04-08 | Discharge: 2024-04-08 | Disposition: A | Discharge status: Home / Self Care | Payer: Self-pay | Attending: Emergency Medicine | Admitting: Emergency Medicine

## 2024-04-08 VITALS — BP 148/80 | HR 71 | Temp 98.5°F | Resp 18

## 2024-04-08 DIAGNOSIS — L0211 Cutaneous abscess of neck: Secondary | ICD-10-CM

## 2024-04-08 DIAGNOSIS — Z23 Encounter for immunization: Secondary | ICD-10-CM

## 2024-04-08 DIAGNOSIS — R030 Elevated blood-pressure reading, without diagnosis of hypertension: Secondary | ICD-10-CM | POA: Diagnosis not present

## 2024-04-08 MED ORDER — DOXYCYCLINE HYCLATE 100 MG PO CAPS: 100.0000 mg | ORAL_CAPSULE | Freq: Two times a day (BID) | ORAL | 0 refills | Status: DC

## 2024-04-08 MED ORDER — TETANUS-DIPHTH-ACELL PERTUSSIS 5-2.5-18.5 LF-MCG/0.5 IM SUSY
0.5000 mL | PREFILLED_SYRINGE | Freq: Once | INTRAMUSCULAR | Status: AC
  Administered 2024-04-08: 0.5 mL via INTRAMUSCULAR

## 2024-04-08 NOTE — Discharge Instructions (Addendum)
 Take the doxycycline  as directed.  Follow-up with your primary care provider on Monday.  Go to the emergency department if you have worsening symptoms.    Your blood pressure is elevated today at 174/92; repeat 148/80.  Please have this rechecked by your primary care provider on Monday.

## 2024-04-08 NOTE — ED Triage Notes (Signed)
 Patient states that he noticed an abscess on the back of his neck, patient has been using warm compresses on the area. Patient states that it feel better today.

## 2024-04-08 NOTE — ED Provider Notes (Signed)
 CAY RALPH PELT    CSN: [id number] Arrival date & time: 04/08/24  1330      History   Chief Complaint Chief Complaint  Patient presents with   Skin Ulcer    Neck blister and swelling - Entered by patient   Abscess    HPI Eric Fitzgerald is a 63 y.o. male.  Patient presents with a skin abscess on the back of his neck x 1 week.  He has been treating it with warm compresses.  No fever, chills, open wounds, drainage.  He is diabetic.  Last tetanus 2016.  The history is provided by the patient and medical records.    Past Medical History:  Diagnosis Date   Diabetes mellitus without complication (HCC)    HLD (hyperlipidemia)    OSA on CPAP 07/2015   sleep study in chart     Patient Active Problem List   Diagnosis Date Noted   Advance care planning 03/09/2024   OSA on CPAP 08/04/2014   Diabetes mellitus type 2, controlled, without complications (HCC) 04/22/2011   Benign prostatic hyperplasia 04/22/2011   Healthcare maintenance 04/16/2011   HLD (hyperlipidemia) 01/28/2007    Past Surgical History:  Procedure Laterality Date   CATARACT EXTRACTION Right    KNEE ARTHROSCOPY  1981   Right   KNEE ARTHROSCOPY Right 09/2015   [place]       Home Medications    Prior to Admission medications   Medication Sig Start Date End Date Taking? Authorizing Provider  atorvastatin  (LIPITOR) 20 MG tablet Take 20 mg by mouth daily.   Yes [provider]  doxycycline  (VIBRAMYCIN ) 100 MG capsule Take 1 capsule (100 mg total) by mouth 2 (two) times daily for 7 days. 04/08/24 04/15/24 Yes Corlis Burnard DEL, NP  metFORMIN  (GLUCOPHAGE ) 500 MG tablet TAKE 1 TABLET BY MOUTH EVERY DAY WITH BREAKFAST 07/19/19  Yes Rilla Baller, MD    Family History Family History  Problem Relation Age of Onset   Other Mother 29       Brain tumor   CAD Father 19       72v CABG   AAA (abdominal aortic aneurysm) Father        smoker   Prostate cancer Father     Bipolar disorder Sister    Depression Sister    CAD Maternal Grandfather        MI   CAD Maternal Uncle        MI   Stroke Neg Hx    Diabetes Neg Hx    Colon cancer Neg Hx     Social History Social History   Tobacco Use   Smoking status: Never   Smokeless tobacco: Never  Vaping Use   Vaping status: Never Used  Substance Use Topics   Alcohol use: Yes    Comment: Rare   Drug use: No     Allergies   Fenofibrate and Niacin   Review of Systems Review of Systems  Constitutional:  Negative for chills and fever.  Musculoskeletal:  Negative for neck pain and neck stiffness.  Skin:  Positive for color change and wound.  Neurological:  Negative for weakness and numbness.     Physical Exam Triage Vital Signs ED Triage Vitals  Encounter Vitals Group     BP 04/08/24 1347 (!) 174/92     Girls Systolic BP Percentile --      Girls Diastolic BP Percentile --  Boys Systolic BP Percentile --      Boys Diastolic BP Percentile --      Pulse Rate 04/08/24 1347 71     Resp 04/08/24 1347 18     Temp 04/08/24 1347 98.5 F (36.9 C)     Temp Source 04/08/24 1347 Oral     SpO2 04/08/24 1347 97 %     Weight --      Height --      Head Circumference --      Peak Flow --      Pain Score 04/08/24 1346 1     Pain Loc --      Pain Education --      Exclude from Growth Chart --    No data found.  Updated Vital Signs BP (!) 148/80   Pulse 71   Temp 98.5 F (36.9 C) (Oral)   Resp 18   SpO2 97%   Visual Acuity Right Eye Distance:   Left Eye Distance:   Bilateral Distance:    Right Eye Near:   Left Eye Near:    Bilateral Near:     Physical Exam Constitutional:      General: He is not in acute distress. HENT:     Mouth/Throat:     Mouth: Mucous membranes are moist.  Cardiovascular:     Rate and Rhythm: Normal rate and regular rhythm.  Pulmonary:     Effort: Pulmonary effort is normal. No respiratory distress.  Musculoskeletal:     Cervical back: Normal range  of motion and neck supple. No rigidity.  Skin:    General: Skin is warm and dry.     Findings: Erythema and lesion present.     Comments: 4 cm tender raised area of erythematous induration on posterior neck with central small pustule.  Neurological:     General: No focal deficit present.     Mental Status: He is alert.     Sensory: No sensory deficit.     Motor: No weakness.     Gait: Gait normal.      UC Treatments / Results  Labs (all labs ordered are listed, but only abnormal results are displayed) Labs Reviewed - No data to display  EKG   Radiology No results found.  Procedures Incision and Drainage  Date/Time: 04/08/2024 2:17 PM  Performed by: Corlis Burnard DEL, NP Authorized by: Corlis Burnard DEL, NP   Consent:    Consent obtained:  Verbal   Consent given by:  Patient   Risks discussed:  Bleeding, incomplete drainage, infection and pain Universal protocol:    Procedure explained and questions answered to patient or proxy's satisfaction: yes   Location:    Type:  Abscess   Location:  Neck   Neck location: Central posterior. Pre-procedure details:    Skin preparation:  Povidone-iodine Anesthesia:    Anesthesia method:  Local infiltration   Local anesthetic:  Lidocaine 1% w/o epi Procedure type:    Complexity:  Simple Procedure details:    Incision types:  Stab incision   Drainage:  Bloody   Drainage amount:  Scant   Wound treatment:  Wound left open   Packing materials:  None Post-procedure details:    Procedure completion:  Tolerated well, no immediate complications  (including critical care time)  Medications Ordered in UC Medications  Tdap (BOOSTRIX ) injection 0.5 mL (has no administration in time range)    Initial Impression / Assessment and Plan / UC Course  I have reviewed the  triage vital signs and the nursing notes.  Pertinent labs & imaging results that were available during my care of the patient were reviewed by me and considered in my medical  decision making (see chart for details).    Skin abscess on posterior neck.  I&D today with return of scant blood only.  Treating with doxycycline  and warm compresses.  Instructed patient to follow-up with his PCP on Monday.  ED precautions given.  Education provided on skin abscess.  Tetanus updated today.  Also discussed with patient that his blood pressure is elevated today and needs to be rechecked by his PCP on Monday.  He agrees to plan of care.  Final Clinical Impressions(s) / UC Diagnoses   Final diagnoses:  Cutaneous abscess of neck  Elevated blood pressure reading     Discharge Instructions      Take the doxycycline  as directed.  Follow-up with your primary care provider on Monday.  Go to the emergency department if you have worsening symptoms.    Your blood pressure is elevated today at 174/92; repeat 148/80.  Please have this rechecked by your primary care provider on Monday.          ED Prescriptions     Medication Sig Dispense Auth. Provider   doxycycline  (VIBRAMYCIN ) 100 MG capsule Take 1 capsule (100 mg total) by mouth 2 (two) times daily for 7 days. 14 capsule Corlis Burnard DEL, NP      PDMP not reviewed this encounter.   Corlis Burnard DEL, NP 04/08/24 [phone]

## 2024-04-11 ENCOUNTER — Ambulatory Visit (INDEPENDENT_AMBULATORY_CARE_PROVIDER_SITE_OTHER): Admitting: General Practice

## 2024-04-11 ENCOUNTER — Encounter: Payer: Self-pay | Admitting: General Practice

## 2024-04-11 VITALS — BP 120/60 | HR 55 | Temp 98.6°F | Ht 70.75 in | Wt 286.4 lb

## 2024-04-11 DIAGNOSIS — L720 Epidermal cyst: Secondary | ICD-10-CM | POA: Diagnosis not present

## 2024-04-11 NOTE — Patient Instructions (Addendum)
 Finish Doxycycline . Make sure you have sun protection while on this antibiotics.   Schedule follow up with Dr. Cleatus if not better.   It was a pleasure to see you today!

## 2024-04-11 NOTE — Progress Notes (Signed)
 Established Patient Office Visit  Subjective   Patient ID: Eric Fitzgerald, male    DOB: 08/11/1960  Age: 63 y.o. MRN: [id number]  Chief Complaint  Patient presents with   Cyst    On back of neck x a week. Went to UC on 9/5 and was given abx and was told to follow up. Cyst is starting to drain a little. Patient is on day 4 of 7 of doxycycline  100 mg tabs.     HPI  Eric Fitzgerald is a 63 year old male, patient of Dr. Cleatus, presents today for an acute visit.   Discussed the use of AI scribe software for clinical note transcription with the patient, who gave verbal consent to proceed.  History of Present Illness He has a cyst located in the middle of the back of his neck. Initially, the cyst was painful, but the pain has decreased since starting doxycycline  four days ago. The cyst is hard, non-movable, and has been draining. He monitors the drainage with his wife to ensure it remains clear.   There is no significant pain or impact on his range of motion.  He received a tetanus shot during his visit to urgent care. He is currently taking doxycycline . No fever, chills, dizziness, chest pain.     Patient Active Problem List   Diagnosis Date Noted   Advance care planning 03/09/2024   Essential hypertension 09/26/2018   OSA on CPAP 08/04/2014   Diabetes mellitus type 2, controlled, without complications (HCC) 04/22/2011   Benign prostatic hyperplasia 04/22/2011   Healthcare maintenance 04/16/2011   HLD (hyperlipidemia) 01/28/2007   Past Medical History:  Diagnosis Date   Diabetes mellitus without complication (HCC)    HLD (hyperlipidemia)    OSA on CPAP 07/2015   sleep study in chart    Past Surgical History:  Procedure Laterality Date   CATARACT EXTRACTION Right    KNEE ARTHROSCOPY  1981   Right   KNEE ARTHROSCOPY Right 09/2015   [place]   Allergies  Allergen Reactions   Fenofibrate     REACTION: joint pain, aches   Niacin      REACTION: hives, skin burning         08/12/2017    8:52 AM  Depression screen PHQ 2/9  Decreased Interest 0  Down, Depressed, Hopeless 0  PHQ - 2 Score 0        No data to display            Review of Systems  Constitutional:  Negative for chills and fever.  Respiratory:  Negative for shortness of breath.   Cardiovascular:  Negative for chest pain.  Gastrointestinal:  Negative for abdominal pain, constipation, diarrhea, heartburn, nausea and vomiting.  Genitourinary:  Negative for dysuria, frequency and urgency.  Neurological:  Negative for dizziness and headaches.  Endo/Heme/Allergies:  Negative for polydipsia.  Psychiatric/Behavioral:  Negative for depression and suicidal ideas. The patient is not nervous/anxious.       Objective:     BP 120/60   Pulse (!) 55   Temp 98.6 F (37 C) (Temporal)   Ht 5' 10.75 (1.797 m)   Wt 286 lb 6.4 oz (129.9 kg)   SpO2 98%   BMI 40.23 kg/m  BP Readings from Last 3 Encounters:  04/11/24 120/60  04/08/24 (!) 148/80  03/07/24 136/78   Wt Readings from Last 3 Encounters:  04/11/24 286 lb 6.4 oz (129.9 kg)  03/07/24 286 lb (  129.7 kg)  02/09/18 267 lb 8 oz (121.3 kg)      Physical Exam Vitals and nursing note reviewed.  Constitutional:      Appearance: Normal appearance.  Neck:   Cardiovascular:     Rate and Rhythm: Normal rate and regular rhythm.     Pulses: Normal pulses.     Heart sounds: Normal heart sounds.  Pulmonary:     Effort: Pulmonary effort is normal.     Breath sounds: Normal breath sounds.  Musculoskeletal:     Cervical back: Full passive range of motion without pain. Erythema present.  Neurological:     Mental Status: He is alert and oriented to person, place, and time.  Psychiatric:        Mood and Affect: Mood normal.        Behavior: Behavior normal.        Thought Content: Thought content normal.        Judgment: Judgment normal.      No results found for any visits on 04/11/24.      The 10-year ASCVD risk score (Arnett DK, et al., 2019) is: 16.7%    Assessment & Plan:  There are no diagnoses linked to this encounter.   Assessment and Plan Assessment & Plan Neck epidermal cyst, improving with antibiotics Epidermal cyst in the neck, less painful, no systemic symptoms. Hard, non-movable. Day four of doxycycline  with size reduction. No infection or abscess. Presentation consistent with cyst. - Continue doxycycline . - Monitor drainage for changes. - Schedule follow-up with Dr. Cleatus post-antibiotics; may cancel if not better.   No follow-ups on file.    Carrol Aurora, NP

## 2024-04-15 ENCOUNTER — Encounter: Payer: Self-pay | Admitting: Family Medicine

## 2024-04-15 ENCOUNTER — Ambulatory Visit (INDEPENDENT_AMBULATORY_CARE_PROVIDER_SITE_OTHER): Admitting: Family Medicine

## 2024-04-15 VITALS — BP 138/76 | HR 71 | Temp 98.7°F | Ht 70.75 in | Wt 286.4 lb

## 2024-04-15 DIAGNOSIS — L720 Epidermal cyst: Secondary | ICD-10-CM

## 2024-04-15 MED ORDER — DOXYCYCLINE HYCLATE 100 MG PO CAPS: 100.0000 mg | ORAL_CAPSULE | Freq: Two times a day (BID) | ORAL | 0 refills | Status: AC

## 2024-04-15 NOTE — Patient Instructions (Signed)
 Wash with soapy water, rinse and pat dry.  Gently squeeze the area after a shower.   If more pain or enlarging, then restart the antibiotics.  Take care.  Glad to see you.

## 2024-04-15 NOTE — Progress Notes (Unsigned)
 He had drainage after I&D.  Done with doxy as of last night.  No fevers.  No pain now.  Still draining some, but less then prior.  Recheck today.   Meds, vitals, and allergies reviewed.   ROS: Per HPI unless specifically indicated in ROS section   Nad Ncat Neck supple, no LA Lesion noted on the posterior neck.  Prev opening is 8mm. Lesion is 20mm in total diameter.  He does not have spreading erythema or fluctuant mass.  It appears to be healing in the meantime.

## 2024-04-17 NOTE — Assessment & Plan Note (Signed)
 Discussed options.  Appears to be improving.  Should heal over. In the meantime, wash with soapy water, rinse and pat dry.  Gently squeeze the area after a shower.   If more pain or enlarging, then restart Doxy and update us .   He agrees to plan.

## 2024-05-09 ENCOUNTER — Ambulatory Visit
Admission: RE | Admit: 2024-05-09 | Discharge: 2024-05-09 | Disposition: A | Discharge status: Home / Self Care | Payer: Self-pay | Source: Ambulatory Visit | Attending: Emergency Medicine | Admitting: Emergency Medicine

## 2024-05-09 ENCOUNTER — Ambulatory Visit: Payer: Self-pay

## 2024-05-09 VITALS — BP 136/73 | HR 70 | Temp 98.9°F | Resp 20

## 2024-05-09 DIAGNOSIS — L02415 Cutaneous abscess of right lower limb: Secondary | ICD-10-CM

## 2024-05-09 MED ORDER — DOXYCYCLINE HYCLATE 100 MG PO CAPS: 100.0000 mg | ORAL_CAPSULE | Freq: Two times a day (BID) | ORAL | 0 refills | Status: DC

## 2024-05-09 NOTE — Telephone Encounter (Signed)
Noted. Was seen at [facility] today.

## 2024-05-09 NOTE — Discharge Instructions (Signed)
 On exam abscess is still firm and is unable to be drained today in clinic however if it returns to ahead and is not opened up on its own at home you may return to clinic for reevaluation  Take doxycycline  twice daily for 7 days  Hold warm-hot compresses to affected area at least 4 times a day, this helps to facilitate draining, the more the better  Please return for evaluation for increased swelling, increased tenderness or pain, non healing site, non draining site, you begin to have fever or chills   We reviewed the etiology of recurrent abscesses of skin.  Skin abscesses are collections of pus within the dermis and deeper skin tissues. Skin abscesses manifest as painful, tender, fluctuant, and erythematous nodules, frequently surmounted by a pustule and surrounded by a rim of erythematous swelling.  Spontaneous drainage of purulent material may occur.  Fever can occur on occasion.    -Skin abscesses can develop in healthy individuals with no predisposing conditions other than skin or nasal carriage of Staphylococcus aureus.  Individuals in close contact with others who have active infection with skin abscesses are at increased risk which is likely to explain why twin brother has similar episodes.   In addition, any process leading to a breach in the skin barrier can also predispose to the development of a skin abscesses, such as atopic dermatitis.

## 2024-05-09 NOTE — Telephone Encounter (Signed)
 Patient called back in; he was seen at urgent care and states he did not have the provider look at his right ankle swelling. He is asking if the swelling is related to the abscess infection. He was prescribed doxycyline. Advised the swelling can be an effect from the abscess infection in that area. Advised patient to follow UC provider's instructions for warm compresses four times daily, take doxycycline  as prescribed, and elevate the extremity. Patient asked to make a follow up appointment with his PCP for next week and states he will call back to cancel if he no longer needs it.

## 2024-05-09 NOTE — ED Provider Notes (Signed)
 Eric Fitzgerald    CSN: [id number] Arrival date & time: 05/09/24  1246      History   Chief Complaint Chief Complaint  Patient presents with   Abscess    Leg infection - Entered by patient    HPI Eric Fitzgerald is a 63 y.o. male.   Patient presents for evaluation of abscess present to the right lower extremity beginning 1-1/2 weeks ago, progressively worsening as area has become tender, red and swollen.  Applied warm compresses and was able to expel a small amount of drainage.  Denies fever.  History of reoccurring abscess in various locations.  Past Medical History:  Diagnosis Date   Diabetes mellitus without complication (HCC)    HLD (hyperlipidemia)    OSA on CPAP 07/2015   sleep study in chart     Patient Active Problem List   Diagnosis Date Noted   Epidermal cyst of neck 04/11/2024   Advance care planning 03/09/2024   Essential hypertension 09/26/2018   OSA on CPAP 08/04/2014   Diabetes mellitus type 2, controlled, without complications (HCC) 04/22/2011   Benign prostatic hyperplasia 04/22/2011   Healthcare maintenance 04/16/2011   HLD (hyperlipidemia) 01/28/2007    Past Surgical History:  Procedure Laterality Date   CATARACT EXTRACTION Right    KNEE ARTHROSCOPY  1981   Right   KNEE ARTHROSCOPY Right 09/2015   [place]       Home Medications    Prior to Admission medications   Medication Sig Start Date End Date Taking? Authorizing Provider  doxycycline  (VIBRAMYCIN ) 100 MG capsule Take 1 capsule (100 mg total) by mouth 2 (two) times daily. 05/09/24  Yes Nuriya Stuck R, NP  atorvastatin  (LIPITOR) 20 MG tablet Take 20 mg by mouth daily.    [provider]  metFORMIN  (GLUCOPHAGE ) 500 MG tablet TAKE 1 TABLET BY MOUTH EVERY DAY WITH BREAKFAST 07/19/19   Rilla Baller, MD    Family History Family History  Problem Relation Age of Onset   Other Mother 22       Brain tumor   CAD Father 20       69v  CABG   AAA (abdominal aortic aneurysm) Father        smoker   Prostate cancer Father    Bipolar disorder Sister    Depression Sister    CAD Maternal Grandfather        MI   CAD Maternal Uncle        MI   Stroke Neg Hx    Diabetes Neg Hx    Colon cancer Neg Hx     Social History Social History   Tobacco Use   Smoking status: Never   Smokeless tobacco: Never  Vaping Use   Vaping status: Never Used  Substance Use Topics   Alcohol use: Yes    Comment: Rare   Drug use: No     Allergies   Fenofibrate and Niacin   Review of Systems Review of Systems   Physical Exam Triage Vital Signs ED Triage Vitals  Encounter Vitals Group     BP 05/09/24 1323 136/73     Girls Systolic BP Percentile --      Girls Diastolic BP Percentile --      Boys Systolic BP Percentile --      Boys Diastolic BP Percentile --      Pulse Rate 05/09/24 1323 70     Resp 05/09/24 1323 20  Temp 05/09/24 1323 98.9 F (37.2 C)     Temp Source 05/09/24 1323 Oral     SpO2 05/09/24 1323 97 %     Weight --      Height --      Head Circumference --      Peak Flow --      Pain Score 05/09/24 1321 2     Pain Loc --      Pain Education --      Exclude from Growth Chart --    No data found.  Updated Vital Signs BP 136/73 (BP Location: Left Arm)   Pulse 70   Temp 98.9 F (37.2 C) (Oral)   Resp 20   SpO2 97%   Visual Acuity Right Eye Distance:   Left Eye Distance:   Bilateral Distance:    Right Eye Near:   Left Eye Near:    Bilateral Near:     Physical Exam Constitutional:      Appearance: Normal appearance.  Eyes:     Extraocular Movements: Extraocular movements intact.  Pulmonary:     Effort: Pulmonary effort is normal.  Skin:    Comments: 2 x 3 firm abscess present to the medial aspect of the right anterior shin,   Neurological:     Mental Status: He is alert and oriented to person, place, and time. Mental status is at baseline.      UC Treatments / Results  Labs (all  labs ordered are listed, but only abnormal results are displayed) Labs Reviewed - No data to display  EKG   Radiology No results found.  Procedures Procedures (including critical care time)  Medications Ordered in UC Medications - No data to display  Initial Impression / Assessment and Plan / UC Course  I have reviewed the triage vital signs and the nursing notes.  Pertinent labs & imaging results that were available during my care of the patient were reviewed by me and considered in my medical decision making (see chart for details).  Abscess of right leg  Presentation consistent with such, no signs of sepsis, stable for outpatient management, abscess firm and unable to complete I&D today, discussed this with patient, prescribed doxycycline  which she has had success with in the past and recommended supportive measures advised to follow-up for nonhealing nondraining site for reevaluation Final Clinical Impressions(s) / UC Diagnoses   Final diagnoses:  Abscess of right leg     Discharge Instructions      On exam abscess is still firm and is unable to be drained today in clinic however if it returns to ahead and is not opened up on its own at home you may return to clinic for reevaluation  Take doxycycline  twice daily for 7 days  Hold warm-hot compresses to affected area at least 4 times a day, this helps to facilitate draining, the more the better  Please return for evaluation for increased swelling, increased tenderness or pain, non healing site, non draining site, you begin to have fever or chills   We reviewed the etiology of recurrent abscesses of skin.  Skin abscesses are collections of pus within the dermis and deeper skin tissues. Skin abscesses manifest as painful, tender, fluctuant, and erythematous nodules, frequently surmounted by a pustule and surrounded by a rim of erythematous swelling.  Spontaneous drainage of purulent material may occur.  Fever can occur on  occasion.    -Skin abscesses can develop in healthy individuals with no predisposing conditions other than skin  or nasal carriage of Staphylococcus aureus.  Individuals in close contact with others who have active infection with skin abscesses are at increased risk which is likely to explain why twin brother has similar episodes.   In addition, any process leading to a breach in the skin barrier can also predispose to the development of a skin abscesses, such as atopic dermatitis.      ED Prescriptions     Medication Sig Dispense Auth. Provider   doxycycline  (VIBRAMYCIN ) 100 MG capsule Take 1 capsule (100 mg total) by mouth 2 (two) times daily. 14 capsule Pharaoh Pio, Shelba SAUNDERS, NP      PDMP not reviewed this encounter.   Teresa Shelba SAUNDERS, NP 05/09/24 1346

## 2024-05-09 NOTE — Telephone Encounter (Signed)
 Agree.  Thanks.  Please have him update us  and get rechecked if worse in the meantime.

## 2024-05-09 NOTE — ED Triage Notes (Signed)
 Patient reports abscess to right lower leg x 1.5 weeks ago. Patient now complains of redness, swelling and pain. Rates pain 2 /10. Patient has been using warm compresses on the area with no improvement.

## 2024-05-09 NOTE — Telephone Encounter (Signed)
 FYI Only or Action Required?: FYI only for provider.  Patient was last seen in primary care on 04/15/2024 by Cleatus Arlyss RAMAN, MD.  Called Nurse Triage reporting Abscess.  Symptoms began several weeks ago.  Interventions attempted: OTC medications: Tylenol and Other: warm compress, popped the abscess head.  Symptoms are: right lower leg lateral abscess about the size of a half dollar with redness and swelling; right foot/ankle swelling (mild) gradually worsening.  Triage Disposition: See Physician Within 24 Hours  Patient/caregiver understands and will follow disposition?: Yes            Copied from CRM [id number]. Topic: Clinical - Red Word Triage >> May 09, 2024  9:23 AM Eric Fitzgerald wrote: Red Word that prompted transfer to Nurse Triage: infection on leg, its really sore, look like his foot is swollen. Stated the infection came within. Reason for Disposition  [1] Boil > 1/2 inch across (> 12 mm; larger than a marble) AND [2] center is soft or pus colored  Answer Assessment - Initial Assessment Questions 1. APPEARANCE of BOIL: What does the boil look like?      Redness, swelling. He states there was a head to it that he was able to pop and get some clear/pus drainage from.  2. LOCATION: Where is the boil located?      Right leg, lateral lower leg.  3. NUMBER: How many boils are there?      1.  4. SIZE: How big is the boil? (e.g., inches, cm; compare to size of a coin or other object)     Size of a half dollar.  5. ONSET: When did the boil start?     2-3 weeks ago, worsened.  6. PAIN: Is there any pain? If Yes, ask: How bad is the pain?   (Scale 1-10; or mild, moderate, severe)     Yes; he states it was worse yesterday. Tender/sore to touch. Mild at rest and moderate if moving around.  7. FEVER: Do you have a fever? If Yes, ask: What is it, how was it measured, and when did it start?      No. Denies any red streaks.  8. SOURCE: Have you been around  anyone with boils or other Staph infections? Have you ever had boils before?     1 month ago he had an abscess.  9. OTHER SYMPTOMS: Do you have any other symptoms? (e.g., shaking chills, weakness, rash elsewhere on body)     Right foot/ankle swelling (mild). Denies chills, weakness, numbness.  10. PREGNANCY: Is there any chance you are pregnant? When was your last menstrual period?       N/A.  Protocols used: Boil (Skin Abscess)-A-AH

## 2024-05-17 ENCOUNTER — Ambulatory Visit: Admitting: Family Medicine

## 2024-05-17 ENCOUNTER — Encounter: Payer: Self-pay | Admitting: Family Medicine

## 2024-05-17 VITALS — BP 134/72 | HR 65 | Temp 98.8°F | Ht 70.75 in | Wt 286.2 lb

## 2024-05-17 DIAGNOSIS — E119 Type 2 diabetes mellitus without complications: Secondary | ICD-10-CM | POA: Diagnosis not present

## 2024-05-17 DIAGNOSIS — L0390 Cellulitis, unspecified: Secondary | ICD-10-CM | POA: Insufficient documentation

## 2024-05-17 DIAGNOSIS — N529 Male erectile dysfunction, unspecified: Secondary | ICD-10-CM

## 2024-05-17 DIAGNOSIS — L03115 Cellulitis of right lower limb: Secondary | ICD-10-CM | POA: Diagnosis not present

## 2024-05-17 MED ORDER — SILDENAFIL CITRATE 20 MG PO TABS: 20.0000 mg | ORAL_TABLET | Freq: Every day | ORAL | 12 refills | Status: AC | PRN

## 2024-05-17 MED ORDER — DOXYCYCLINE HYCLATE 100 MG PO CAPS: 100.0000 mg | ORAL_CAPSULE | Freq: Two times a day (BID) | ORAL | 0 refills | Status: DC

## 2024-05-17 NOTE — Assessment & Plan Note (Signed)
 Okay for outpatient f/u.  Recheck labs pending.  I don't suspect underlying immune deficiency.   Using irish spring soap.  D/w pt about using dial or similar 1-2 times per week.   Hold doxy for now.  Restart if progressive sx on the R shin.

## 2024-05-17 NOTE — Assessment & Plan Note (Signed)
 Can use sildenafil prn.  Routine cautions d/w pt.

## 2024-05-17 NOTE — Progress Notes (Signed)
 UC f/u.  He had local irritation, pain, redness.  Then the area came to a head on R shin.  Done with doxy. Discussed prev skin infections.   Sugar has been ~130s.  Redness and pain are clearly better.  Local swelling and R foot swelling is better.  Didn't have foot pain prev.    Recheck labs pending given h/o DM2.   ED.  No NTG use.  D/w pt about routine cautions and dosing sildenafil.   Meds, vitals, and allergies reviewed.   ROS: Per HPI unless specifically indicated in ROS section   Nad Ncat Rrr Ctab Small residual area on the R anterior lateral shin with scab removed already. Not ttp, no spreading erythema.  No fluctuant mass. Covered with bandaid.

## 2024-05-17 NOTE — Patient Instructions (Signed)
 Go to the lab on the way out.   If you have mychart we'll likely use that to update you.    Take care.  Glad to see you. Try using dial soap or similar about 1-2 times per week.  Hold doxycycline  for now.

## 2024-05-18 LAB — COMPREHENSIVE METABOLIC PANEL WITH GFR
ALT: 29 U/L (ref 0–53)
AST: 21 U/L (ref 0–37)
Albumin: 4.5 g/dL (ref 3.5–5.2)
Alkaline Phosphatase: 56 U/L (ref 39–117)
BUN: 18 mg/dL (ref 6–23)
CO2: 27 meq/L (ref 19–32)
Calcium: 9.2 mg/dL (ref 8.4–10.5)
Chloride: 101 meq/L (ref 96–112)
Creatinine, Ser: 1.07 mg/dL (ref 0.40–1.50)
GFR: 72.98 mL/min (ref >60.00)
Glucose, Bld: 101 mg/dL — ABNORMAL HIGH (ref 70–99)
Potassium: 3.9 meq/L (ref 3.5–5.1)
Sodium: 136 meq/L (ref 135–145)
Total Bilirubin: 0.4 mg/dL (ref 0.2–1.2)
Total Protein: 7.9 g/dL (ref 6.0–8.3)

## 2024-05-18 LAB — CBC WITH DIFFERENTIAL/PLATELET
Basophils Absolute: 0.1 K/uL (ref 0.0–0.1)
Basophils Relative: 0.9 % (ref 0.0–3.0)
Eosinophils Absolute: 0.2 K/uL (ref 0.0–0.7)
Eosinophils Relative: 2.8 % (ref 0.0–5.0)
HCT: 45.2 % (ref 39.0–52.0)
Hemoglobin: 14.9 g/dL (ref 13.0–17.0)
Lymphocytes Relative: 24.7 % (ref 12.0–46.0)
Lymphs Abs: 1.9 K/uL (ref 0.7–4.0)
MCHC: 32.9 g/dL (ref 30.0–36.0)
MCV: 91.2 fl (ref 78.0–100.0)
Monocytes Absolute: 0.6 K/uL (ref 0.1–1.0)
Monocytes Relative: 7.4 % (ref 3.0–12.0)
Neutro Abs: 5 K/uL (ref 1.4–7.7)
Neutrophils Relative %: 64.2 % (ref 43.0–77.0)
Platelets: 296 K/uL (ref 150.0–400.0)
RBC: 4.96 Mil/uL (ref 4.22–5.81)
RDW: 13.7 % (ref 11.5–15.5)
WBC: 7.8 K/uL (ref 4.0–10.5)

## 2024-05-18 LAB — HEMOGLOBIN A1C: Hgb A1c MFr Bld: 7 % — ABNORMAL HIGH (ref 4.6–6.5)

## 2024-05-23 ENCOUNTER — Ambulatory Visit: Payer: Self-pay | Admitting: Family Medicine

## 2024-06-07 ENCOUNTER — Ambulatory Visit: Admitting: Family Medicine

## 2024-06-07 ENCOUNTER — Encounter: Payer: Self-pay | Admitting: Family Medicine

## 2024-06-07 VITALS — BP 136/78 | HR 58 | Temp 97.9°F | Ht 70.75 in | Wt 287.0 lb

## 2024-06-07 DIAGNOSIS — E785 Hyperlipidemia, unspecified: Secondary | ICD-10-CM

## 2024-06-07 DIAGNOSIS — H269 Unspecified cataract: Secondary | ICD-10-CM | POA: Diagnosis not present

## 2024-06-07 DIAGNOSIS — Z23 Encounter for immunization: Secondary | ICD-10-CM

## 2024-06-07 DIAGNOSIS — Z7984 Long term (current) use of oral hypoglycemic drugs: Secondary | ICD-10-CM | POA: Diagnosis not present

## 2024-06-07 DIAGNOSIS — E119 Type 2 diabetes mellitus without complications: Secondary | ICD-10-CM

## 2024-06-07 MED ORDER — METFORMIN HCL 500 MG PO TABS: 500.0000 mg | ORAL_TABLET | Freq: Two times a day (BID) | ORAL | 3 refills | Status: AC

## 2024-06-07 MED ORDER — ATORVASTATIN CALCIUM 20 MG PO TABS: 20.0000 mg | ORAL_TABLET | Freq: Every day | ORAL | 3 refills | Status: AC

## 2024-06-07 NOTE — Patient Instructions (Addendum)
 Refer to Dr Arlyss King at [facility]. [phone]  Recheck in about 6 months at a yearly visit with labs ahead of time.   Try taking metformin  twice a day. Update me as needed, esp if GI upset.    Take care.  Glad to see you.

## 2024-06-07 NOTE — Progress Notes (Unsigned)
 Diabetes:  Using medications without difficulties: yes Hypoglycemic episodes: no Hyperglycemic episodes: no Feet problems: no Blood Sugars averaging: usually ~ 110s at home.   eye exam within last year: done 2024, no retinopathy.   D/w pt about diet and carbs.  D/w pt about exercise- mainly yard work.  He is thinking about joining the [facility] [employer].    He had less joint aches on lipitor at 20mg  vs 40mg .    He is dealing with R eye nighttime glare after prev cataract surgery.  D/w pt about second opinion.  Referral placed.    His skin sx improved on the R shin.  He changed soaps in the meantime.   Had a flu shot today.  He prev had RSV vaccine.  D/w pt.   Meds, vitals, and allergies reviewed.   ROS: Per HPI unless specifically indicated in ROS section   GEN: nad, alert and oriented HEENT: ncat NECK: supple w/o LA CV: rrr. PULM: ctab, no inc wob ABD: soft, +bs EXT: no edema SKIN: well perfused.

## 2024-06-08 DIAGNOSIS — H269 Unspecified cataract: Secondary | ICD-10-CM | POA: Insufficient documentation

## 2024-06-08 NOTE — Assessment & Plan Note (Signed)
 He is dealing with R eye nighttime glare after prev cataract surgery.  D/w pt about second opinion.  Referral placed.

## 2024-06-08 NOTE — Assessment & Plan Note (Signed)
  He had less joint aches on lipitor at 20mg  vs 40mg .  Continue as is.

## 2024-06-08 NOTE — Assessment & Plan Note (Signed)
 Recheck in about 6 months at a yearly visit with labs ahead of time.   Try taking metformin  twice a day. Update me as needed, esp if GI upset.    Continue work on diet and exercise.

## 2024-06-28 ENCOUNTER — Encounter: Payer: Self-pay | Admitting: Family Medicine

## 2024-07-10 ENCOUNTER — Telehealth: Payer: Self-pay | Admitting: Family Medicine

## 2024-07-10 DIAGNOSIS — I714 Abdominal aortic aneurysm, without rupture, unspecified: Secondary | ICD-10-CM

## 2024-07-10 NOTE — Telephone Encounter (Signed)
 Please check with patient.  Not urgent but with his family history of AAA would be reasonable to get a follow-up ultrasound done.  I have the order pended below.  If he wants to get this done then please let me know and I will sign the order.  I think it would be reasonable to get done.  Thanks.

## 2024-08-25 NOTE — Telephone Encounter (Signed)
Please check with patient about this.  Thanks.

## 2024-08-26 NOTE — Telephone Encounter (Signed)
 Left message to return call to office. Ok to [employer] below.

## 2024-08-26 NOTE — Telephone Encounter (Signed)
 Patient returned call to CMA. Patient stated that he is okay with the order for the follow up US .

## 2024-08-26 NOTE — Telephone Encounter (Signed)
 Ordered.  Thanks.  He should get a call about scheduling.  Let me know if he cannot get scheduled.

## 2024-08-26 NOTE — Telephone Encounter (Signed)
 Informed pt ultrasound was ordered and to let us  know if he cannot get scheduled

## 2024-11-28 ENCOUNTER — Other Ambulatory Visit

## 2024-12-05 ENCOUNTER — Encounter: Admitting: Family Medicine
